# Patient Record
Sex: Male | Born: 1980 | Race: White | Hispanic: No | Marital: Single | Smoking: Current every day smoker
Health system: Southern US, Community
[De-identification: ages and names within clinical notes are randomized; demographics above are authoritative.]

## PROBLEM LIST (undated history)

## (undated) DIAGNOSIS — I10 Essential (primary) hypertension: Secondary | ICD-10-CM

## (undated) DIAGNOSIS — E119 Type 2 diabetes mellitus without complications: Secondary | ICD-10-CM

## (undated) DIAGNOSIS — I509 Heart failure, unspecified: Secondary | ICD-10-CM

---

## 2008-05-01 ENCOUNTER — Inpatient Hospital Stay (HOSPITAL_COMMUNITY): Admission: EM | Admit: 2008-05-01 | Discharge: 2008-05-21 | Payer: Self-pay | Admitting: Pulmonary Disease

## 2008-05-01 ENCOUNTER — Ambulatory Visit: Payer: Self-pay | Admitting: Pulmonary Disease

## 2008-05-02 ENCOUNTER — Encounter: Payer: Self-pay | Admitting: Internal Medicine

## 2008-05-08 ENCOUNTER — Ambulatory Visit: Payer: Self-pay | Admitting: Infectious Diseases

## 2010-12-13 NOTE — Consult Note (Signed)
NAMEKENYON, Micheal Zavala                   ACCOUNT NO.:  [id number]   MEDICAL RECORD NO.:  [id number]          PATIENT TYPE:  INP   LOCATION:  2107                         FACILITY:  [facility]   PHYSICIAN:  Maree Krabbe, M.D.DATE OF BIRTH:  Sep 29, 1980   DATE OF CONSULTATION:  DATE OF DISCHARGE:                                 CONSULTATION   REASON FOR CONSULT:  Elevated creatinine.   HISTORY:  This is a 30 year old male with no significant past medical  history.  He has been sick for about a week.  The family says he had the  flu initially with fever, chills, and cough.  Four days prior to  admission, on May 01, 2008, he was diagnosed with influenza A, I  believe probably in an emergency room or urgent care setting.  He then  developed profuse watery diarrhea, high volume according to the pt's  wife, two days prior to admission.  He was then was admitted on May 01, 2008, with respiratory failure and diffuse bilateral pulmonary  infiltrates.  The patient's creatinine was normal on admission at 0.7,  yesterday increased up to 1.7 and today is 2.9.  Urine output has been  low less than 30 mL/hour.  Blood pressures here in the hospital have  been low in the 90s to low 100s primarily and some in the 80s yesterday.  The patient has not received any nephrotoxins, has not received any IV  contrast, ACE inhibitors, etc.   He was admitted to the hospital on May 01, 2008, started on broad-  spectrum antibiotics as well as Tamiflu suspension.  He is currently on  Zithromax, Zosyn, and vancomycin.  He has been febrile.  His mental  status has been good.  Urine output has been poor as I mentioned above.   PAST MEDICAL HISTORY:  None.   MEDICATIONS AT HOME:  None.   CURRENT MEDICATIONS:  Tamiflu, Zosyn, vancomycin, Zithromax IV,  Lopressor 12.5 mg b.i.d., Protonix 40 via tube, Jevity 80 mL an hour,  subcu heparin prophylactic, and sedation protocols.   SOCIAL HISTORY:  The patient lives  with his wife.  The family is not  available to answer questions about alcohol, drug, or tobacco use.   REVIEW OF SYSTEMS:  As noted above.  He has no prior history of any  kidney problems, kidney stones, kidney failure, protein, or blood in the  urine.  The rest of the 12-point review of systems was negative  according to the wife.   PHYSICAL EXAMINATION:  VITAL SIGNS:  Blood pressure currently 106/70,  heart rate 90s, respirations 18 on the ventilator.  Temperature 100.0.  GENERAL:  The patient is an obese white male.  He awakens easily, grips  with both hands, nods his head and responds to questions appropriately.  SKIN:  Warm and dry.  There is no rash.  HEENT:  Pupils are constricted at 1-2 mm symmetric.  Fundi could not be  visualized.  Throat cannot be examined due to the ET tube and the  orogastric tube.  TMs were occluded by cerumen.  NECK:  Supple.  There is no JVD.  Neck veins are flat.  CHEST:  Some coarse breath sounds at both posterior lung fields.  No  wheezing.  CARDIAC:  Regular rate and rhythm with no murmur, rub, or gallop.  ABDOMEN:  Obese, soft, and nontender.  The liver percusses to 8 cm in  the right midclavicular line.  There are no masses.  No ascites.  EXTREMITIES:  Femoral pulses are soft.  Pedal pulses are intact.  There  is no edema whatsoever in the legs, abdominal wall, or flanks.   LABORATORY DATA:  PH 7.31/PCO2 48, PO2 is 68, white blood count 4000,  hemoglobin 12, platelets are normal.  Sodium 137, potassium 4.2, bicarb  22, BUN 42, creatinine 2.92, calcium 8, phosphorus 4.8, magnesium 2.3.  CPK 246, vancomycin level 38.  Urinalysis shows 100 protein, 3 to 6 red  blood cells, strep pneumoniae urinary antigen negative.  Chest x-ray,  diffuse bilateral airspace disease.   IMPRESSION:  1. Acute renal failure due to hypoperfusion and volume depletion      primarily; he may have an element of sepsis but he also has      significant room for volume.  2.  Recent flu-like illness for 1 week with a two-day history of acute      diarrhea prior to admission.  3. Bilateral pneumonia with respiratory failure.  4. Obesity.   RECOMMENDATIONS:  Increase fluids significantly, watch urine output and  serum creatinine.  We will follow with you.  Avoid nephrotoxins.      Maree Krabbe, M.D.  Electronically Signed     RDS/MEDQ  D:  05/03/2008  T:  05/04/2008  Job:  [id number]

## 2010-12-13 NOTE — Discharge Summary (Signed)
NAMELEMOND, GRIFFEE                   ACCOUNT NO.:  [id number]   MEDICAL RECORD NO.:  [id number]          PATIENT TYPE:  INP   LOCATION:  6743                         FACILITY:  [facility]   PHYSICIAN:  Oretha Milch, MD      DATE OF BIRTH:  May 27, 1981   DATE OF ADMISSION:  05/01/2008  DATE OF DISCHARGE:  05/21/2008                               DISCHARGE SUMMARY   FINAL DIAGNOSES:  1. Resolved acute respiratory failure/acute respiratory distress      syndrome secondary to H1N1 virus.  2. Resolved severe sepsis septic shock with multiple organ dysfunction      secondary to acute respiratory failure/acute respiratory distress      syndrome.  3. Resolving acute renal failure.   PROCEDURES:  1. Right subclavian triple-lumen catheter placed on May 01, 2008,      removed on May 08, 2008.  2. Left intrajugular triple-lumen catheter placed on May 08, 2008,      removed prior to discharge.  3. Right intrajugular hemodialysis catheter placed on May 09, 2008, removed on May 20, 2008.  4. Right radial arterial line placed on May 01, 2008, removed on      May 08, 2008.  5. Endotracheal tube placed on May 01, 2008, removed on May 10, 2008.   MICROBIOLOGY:  Blood cultures x2, both on May 19, 2008, no growth to  date.  Blood cultures x2 on May 12, 2008, negative.  Cath tip on  May 13, 2008, negative.  Cath tip on May 10, 2008, negative.  Blood cultures on May 07, 2008, negative.  Bronchial Legionella smear  negative on May 07, 2008.  Blood cultures on May 06, 2008,  negative.  Sputum culture on May 08, 2008, demonstrating few Candida  albicans.  Respiratory virus panel on May 07, 2008, showing influenza  A, positive H1N1.   BRIEF HISTORY:  This is a 30 year old male patient who presented to  [facility] with complaints of increased respiratory distress and  pneumonia.  He had a 4-day history of positive influenza A,  discharged  from the emergency room at that point.  Upon presentation at [facility], he was placed on BiPAP with little improvement.  He,  therefore, ended up being intubated for progressive respiratory failure.   HOSPITAL COURSE:  1. Acute respiratory failure secondary to acute respiratory distress      syndrome in the setting of influenza A.  Mr. Haywood as previously      mentioned was admitted to the Intensive Care on mechanical      ventilation who was placed on full ventilatory support, treated      empirically with broad-spectrum antibiotics as well as Tamiflu.  He      was treated with low-tidal volume mechanical ventilation,      aggressive sedation, and high PEEP.  He was eventually successfully      liberated from mechanical ventilation as on date noted above.  Upon      time of discharge, his pulmonary status  has improved to baseline.      He is off oxygen, has no residual pulmonary issues.  He was treated      empirically as mentioned before with oral Tamiflu x7 days,      additionally, he completed a full course of therapy with      ceftriaxone and azithromycin.  2. Severe sepsis septic shock secondary to acute respiratory failure.      This was treated in the usual fashion with aggressive goal-directed      therapy including volume resuscitation, brief support with IV      vasopressors including Levophed and vasopressin, and close      observation in the ICU.  He continued to improve.  He is now      hemodynamically stable.  He did develop multiple organ distress      syndrome during acute stay, he now has resolving acute renal      failure.  3. Acute renal failure secondary to severe sepsis septic shock.  This      is slowly improving.  Nephrology is following.  His creatinine is      making rapid improvements upon time of discharge.  He will be      followed by Dr. Eliott Nine in the outpatient setting with plans to have      his creatinine drawn at [facility]  and call to Dr. Eliott Nine,      and also a face-to-face followup with Dr. Eliott Nine in the future.  4. Anemia.  For this, he will continue ferrous sulfate.  5. Hypertension.  For this, he will continue a low-sodium diet.   DISCHARGE LABORATORY DATA:  On May 21, 2008, sodium 132, potassium  3.7, chloride 99, CO2 19, glucose 87, BUN 69, creatinine 5.23, this is  compared to May 20, 2008, with sodium 131, potassium 4.3, chloride  99, CO2 20, glucose 97, BUN 75, creatinine 6.74.  CBC on time of  discharge white blood cell count 9, hemoglobin 8.7, hematocrit 24.3,  platelet count 283.  Iron levels 31, total iron binding capacity is  365%, saturation UIBC 334.   DISCHARGE INSTRUCTIONS:  Instructed to avoid NSAIDs, follow a low-sodium  diet, increase activity as tolerated.  He will go to [facility]  first on May 28, 2008, where he will have his renal profile drawn  and call to Dr. Eliott Nine.  He will also have a followup with Dr. Eliott Nine,  her office will arrange this.   DISCHARGE MEDICATIONS:  1. Aspirin 81 mg daily.  2. Ferrous sulfate 325 mg 1 tablet 3 times a day.  3. Tums E-X 2 tablets daily with meals.  4. Tylenol 325 mg 2 tablets q.4 h. as needed for pain.   DISPOSITION:  Mr. Schlageter has met maximum benefit from inpatient stay.  He  is now medically cleared for discharge with outpatient followup with Dr.  Eliott Nine.      Zenia Resides, NP      Oretha Milch, MD  Electronically Signed    PB/MEDQ  D:  05/21/2008  T:  05/21/2008  Job:  [id number]

## 2011-05-02 LAB — POCT I-STAT 3, ART BLOOD GAS (G3+)
Acid-Base Excess: 2
Acid-base deficit: 1
Acid-base deficit: 2
Acid-base deficit: 2
Acid-base deficit: 4 — ABNORMAL HIGH
Acid-base deficit: 4 — ABNORMAL HIGH
Acid-base deficit: 5 — ABNORMAL HIGH
Acid-base deficit: 6 — ABNORMAL HIGH
Bicarbonate: 20.1
Bicarbonate: 22.1
Bicarbonate: 22.6
Bicarbonate: 23.7
Bicarbonate: 23.7
Bicarbonate: 23.8
Bicarbonate: 24
Bicarbonate: 24.2 — ABNORMAL HIGH
Bicarbonate: 24.6 — ABNORMAL HIGH
Bicarbonate: 31.1 — ABNORMAL HIGH
O2 Saturation: 88
O2 Saturation: 91
O2 Saturation: 93
O2 Saturation: 93
O2 Saturation: 94
O2 Saturation: 94
O2 Saturation: 95
O2 Saturation: 95
O2 Saturation: 99
Patient temperature: 100.3
Patient temperature: 101
Patient temperature: 102
Patient temperature: 102
Patient temperature: 102.4
Patient temperature: 102.8
Patient temperature: 99
Patient temperature: 99.7
TCO2: 21
TCO2: 23
TCO2: 24
TCO2: 25
TCO2: 25
TCO2: 26
TCO2: 34
pCO2 arterial: 111.8
pCO2 arterial: 37.7
pCO2 arterial: 39.3
pCO2 arterial: 40.3
pCO2 arterial: 42.2
pCO2 arterial: 45.1 — ABNORMAL HIGH
pCO2 arterial: 45.7 — ABNORMAL HIGH
pCO2 arterial: 46.6 — ABNORMAL HIGH
pCO2 arterial: 48.5 — ABNORMAL HIGH
pCO2 arterial: 49.8 — ABNORMAL HIGH
pH, Arterial: 7.064 — CL
pH, Arterial: 7.286 — ABNORMAL LOW
pH, Arterial: 7.3 — ABNORMAL LOW
pH, Arterial: 7.306 — ABNORMAL LOW
pH, Arterial: 7.31 — ABNORMAL LOW
pH, Arterial: 7.334 — ABNORMAL LOW
pH, Arterial: 7.34 — ABNORMAL LOW
pH, Arterial: 7.431
pO2, Arterial: 177 — ABNORMAL HIGH
pO2, Arterial: 66 — ABNORMAL LOW
pO2, Arterial: 68 — ABNORMAL LOW
pO2, Arterial: 71 — ABNORMAL LOW
pO2, Arterial: 71 — ABNORMAL LOW
pO2, Arterial: 75 — ABNORMAL LOW
pO2, Arterial: 79 — ABNORMAL LOW
pO2, Arterial: 89
pO2, Arterial: 93

## 2011-05-02 LAB — GLUCOSE, CAPILLARY
Glucose-Capillary: 101 — ABNORMAL HIGH
Glucose-Capillary: 103 — ABNORMAL HIGH
Glucose-Capillary: 106 — ABNORMAL HIGH
Glucose-Capillary: 108 — ABNORMAL HIGH
Glucose-Capillary: 114 — ABNORMAL HIGH
Glucose-Capillary: 119 — ABNORMAL HIGH
Glucose-Capillary: 123 — ABNORMAL HIGH
Glucose-Capillary: 125 — ABNORMAL HIGH
Glucose-Capillary: 125 — ABNORMAL HIGH
Glucose-Capillary: 129 — ABNORMAL HIGH
Glucose-Capillary: 132 — ABNORMAL HIGH
Glucose-Capillary: 136 — ABNORMAL HIGH
Glucose-Capillary: 137 — ABNORMAL HIGH
Glucose-Capillary: 141 — ABNORMAL HIGH
Glucose-Capillary: 145 — ABNORMAL HIGH
Glucose-Capillary: 145 — ABNORMAL HIGH
Glucose-Capillary: 151 — ABNORMAL HIGH
Glucose-Capillary: 159 — ABNORMAL HIGH
Glucose-Capillary: 159 — ABNORMAL HIGH
Glucose-Capillary: 159 — ABNORMAL HIGH
Glucose-Capillary: 161 — ABNORMAL HIGH
Glucose-Capillary: 168 — ABNORMAL HIGH
Glucose-Capillary: 168 — ABNORMAL HIGH
Glucose-Capillary: 225 — ABNORMAL HIGH
Glucose-Capillary: 97

## 2011-05-02 LAB — CULTURE, BLOOD (ROUTINE X 2)
Culture: NO GROWTH
Culture: NO GROWTH

## 2011-05-02 LAB — MYCOPLASMA PNEUMONIAE BY PCR

## 2011-05-02 LAB — RENAL FUNCTION PANEL
Albumin: 1.9 — ABNORMAL LOW
Albumin: 2.2 — ABNORMAL LOW
Albumin: 2.5 — ABNORMAL LOW
Albumin: 3 — ABNORMAL LOW
Albumin: 3.1 — ABNORMAL LOW
Albumin: 3.1 — ABNORMAL LOW
BUN: 53 — ABNORMAL HIGH
BUN: 69 — ABNORMAL HIGH
BUN: 73 — ABNORMAL HIGH
BUN: 80 — ABNORMAL HIGH
BUN: 80 — ABNORMAL HIGH
BUN: 81 — ABNORMAL HIGH
BUN: 92 — ABNORMAL HIGH
CO2: 19
CO2: 19
CO2: 20
CO2: 22
CO2: 23
Calcium: 8.1 — ABNORMAL LOW
Calcium: 8.1 — ABNORMAL LOW
Calcium: 8.6
Calcium: 8.9
Calcium: 8.9
Calcium: 9.2
Chloride: 100
Chloride: 103
Chloride: 108
Chloride: 99
Creatinine, Ser: 11.43 — ABNORMAL HIGH
Creatinine, Ser: 11.91 — ABNORMAL HIGH
Creatinine, Ser: 5.23 — ABNORMAL HIGH
Creatinine, Ser: 7.79 — ABNORMAL HIGH
Creatinine, Ser: 7.81 — ABNORMAL HIGH
GFR calc Af Amer: 10 — ABNORMAL LOW
GFR calc Af Amer: 7 — ABNORMAL LOW
GFR calc non Af Amer: 5 — ABNORMAL LOW
Glucose, Bld: 104 — ABNORMAL HIGH
Glucose, Bld: 149 — ABNORMAL HIGH
Glucose, Bld: 77
Glucose, Bld: 95
Glucose, Bld: 97
Glucose, Bld: 98
Phosphorus: 10 — ABNORMAL HIGH
Phosphorus: 11.7 — ABNORMAL HIGH
Phosphorus: 4
Phosphorus: 6.2 — ABNORMAL HIGH
Phosphorus: 7.5 — ABNORMAL HIGH
Phosphorus: 9.5 — ABNORMAL HIGH
Potassium: 3.8
Potassium: 4.1
Potassium: 4.5
Potassium: 4.7
Sodium: 129 — ABNORMAL LOW
Sodium: 131 — ABNORMAL LOW
Sodium: 136
Sodium: 139
Sodium: 142

## 2011-05-02 LAB — STREP PNEUMONIAE URINARY ANTIGEN: Strep Pneumo Urinary Antigen: NEGATIVE

## 2011-05-02 LAB — CBC
HCT: 24.3 — ABNORMAL LOW
HCT: 24.6 — ABNORMAL LOW
HCT: 25.7 — ABNORMAL LOW
HCT: 25.8 — ABNORMAL LOW
HCT: 26.5 — ABNORMAL LOW
HCT: 31.1 — ABNORMAL LOW
HCT: 33.3 — ABNORMAL LOW
HCT: 36.3 — ABNORMAL LOW
HCT: 41.1
HCT: 44.1
Hemoglobin: 10.5 — ABNORMAL LOW
Hemoglobin: 14
Hemoglobin: 8.6 — ABNORMAL LOW
Hemoglobin: 8.9 — ABNORMAL LOW
Hemoglobin: 9 — ABNORMAL LOW
MCHC: 33.5
MCHC: 33.6
MCHC: 33.6
MCHC: 33.8
MCHC: 34.2
MCHC: 34.3
MCHC: 34.4
MCHC: 34.4
MCHC: 34.6
MCHC: 35.2
MCHC: 35.3
MCV: 89.8
MCV: 90.4
MCV: 90.5
MCV: 91.1
MCV: 91.2
MCV: 91.5
MCV: 91.7
MCV: 92.1
MCV: 92.1
MCV: 92.4
Platelets: 205
Platelets: 224
Platelets: 227
Platelets: 245
Platelets: 283
Platelets: 300
Platelets: 344
Platelets: 349
Platelets: 389
RBC: 2.69 — ABNORMAL LOW
RBC: 2.81 — ABNORMAL LOW
RBC: 3.34 — ABNORMAL LOW
RBC: 3.36 — ABNORMAL LOW
RBC: 3.59 — ABNORMAL LOW
RBC: 3.65 — ABNORMAL LOW
RBC: 3.71 — ABNORMAL LOW
RBC: 3.92 — ABNORMAL LOW
RBC: 4.47
RDW: 13.5
RDW: 13.6
RDW: 13.6
RDW: 14
RDW: 14.4
RDW: 14.9
WBC: 18.8 — ABNORMAL HIGH
WBC: 28 — ABNORMAL HIGH
WBC: 3.3 — ABNORMAL LOW
WBC: 3.7 — ABNORMAL LOW
WBC: 35.5 — ABNORMAL HIGH
WBC: 4.9
WBC: 9

## 2011-05-02 LAB — URINALYSIS, ROUTINE W REFLEX MICROSCOPIC
Bilirubin Urine: NEGATIVE
Ketones, ur: NEGATIVE
Leukocytes, UA: NEGATIVE
Leukocytes, UA: NEGATIVE
Nitrite: NEGATIVE
Nitrite: NEGATIVE
Nitrite: NEGATIVE
Protein, ur: 100 — AB
Specific Gravity, Urine: 1.026
Specific Gravity, Urine: 1.031 — ABNORMAL HIGH
Urobilinogen, UA: 0.2
pH: 5
pH: 5.5
pH: 6

## 2011-05-02 LAB — BASIC METABOLIC PANEL
BUN: 100 — ABNORMAL HIGH
BUN: 103 — ABNORMAL HIGH
BUN: 105 — ABNORMAL HIGH
BUN: 19
BUN: 38 — ABNORMAL HIGH
BUN: 42 — ABNORMAL HIGH
BUN: 55 — ABNORMAL HIGH
BUN: 64 — ABNORMAL HIGH
BUN: 66 — ABNORMAL HIGH
BUN: 84 — ABNORMAL HIGH
BUN: 89 — ABNORMAL HIGH
CO2: 16 — ABNORMAL LOW
CO2: 17 — ABNORMAL LOW
CO2: 20
CO2: 21
CO2: 22
CO2: 22
CO2: 22
CO2: 23
CO2: 23
CO2: 23
Calcium: 7.7 — ABNORMAL LOW
Calcium: 8.4
Calcium: 8.5
Calcium: 8.7
Calcium: 8.9
Calcium: 9.1
Calcium: 9.5
Chloride: 102
Chloride: 102
Chloride: 102
Chloride: 103
Chloride: 110
Chloride: 92 — ABNORMAL LOW
Chloride: 98
Chloride: 99
Chloride: 99
Creatinine, Ser: 1.75 — ABNORMAL HIGH
Creatinine, Ser: 10.23 — ABNORMAL HIGH
Creatinine, Ser: 10.47 — ABNORMAL HIGH
Creatinine, Ser: 10.87 — ABNORMAL HIGH
Creatinine, Ser: 11 — ABNORMAL HIGH
Creatinine, Ser: 11.43 — ABNORMAL HIGH
Creatinine, Ser: 4.99 — ABNORMAL HIGH
Creatinine, Ser: 5.58 — ABNORMAL HIGH
Creatinine, Ser: 6.81 — ABNORMAL HIGH
Creatinine, Ser: 7.17 — ABNORMAL HIGH
Creatinine, Ser: 8.02 — ABNORMAL HIGH
Creatinine, Ser: 8.4 — ABNORMAL HIGH
Creatinine, Ser: 8.96 — ABNORMAL HIGH
GFR calc Af Amer: 10 — ABNORMAL LOW
GFR calc Af Amer: 11 — ABNORMAL LOW
GFR calc Af Amer: 12 — ABNORMAL LOW
GFR calc Af Amer: 17 — ABNORMAL LOW
GFR calc Af Amer: 32 — ABNORMAL LOW
GFR calc Af Amer: 57 — ABNORMAL LOW
GFR calc Af Amer: 6 — ABNORMAL LOW
GFR calc Af Amer: 7 — ABNORMAL LOW
GFR calc Af Amer: 8 — ABNORMAL LOW
GFR calc Af Amer: 9 — ABNORMAL LOW
GFR calc Af Amer: 9 — ABNORMAL LOW
GFR calc non Af Amer: 10 — ABNORMAL LOW
GFR calc non Af Amer: 26 — ABNORMAL LOW
GFR calc non Af Amer: 5 — ABNORMAL LOW
GFR calc non Af Amer: 6 — ABNORMAL LOW
GFR calc non Af Amer: 7 — ABNORMAL LOW
GFR calc non Af Amer: 7 — ABNORMAL LOW
GFR calc non Af Amer: 8 — ABNORMAL LOW
Glucose, Bld: 112 — ABNORMAL HIGH
Glucose, Bld: 117 — ABNORMAL HIGH
Glucose, Bld: 120 — ABNORMAL HIGH
Glucose, Bld: 120 — ABNORMAL HIGH
Glucose, Bld: 120 — ABNORMAL HIGH
Glucose, Bld: 142 — ABNORMAL HIGH
Glucose, Bld: 154 — ABNORMAL HIGH
Glucose, Bld: 155 — ABNORMAL HIGH
Glucose, Bld: 161 — ABNORMAL HIGH
Glucose, Bld: 88
Potassium: 3.7
Potassium: 3.9
Potassium: 3.9
Potassium: 4
Potassium: 4
Potassium: 4.2
Potassium: 4.4
Potassium: 4.5
Sodium: 136
Sodium: 137
Sodium: 138
Sodium: 139
Sodium: 141
Sodium: 142

## 2011-05-02 LAB — CATH TIP CULTURE
Culture: NO GROWTH
Culture: NO GROWTH
Culture: NO GROWTH

## 2011-05-02 LAB — HEPATITIS PANEL, ACUTE
HCV Ab: NEGATIVE
Hepatitis B Surface Ag: NEGATIVE

## 2011-05-02 LAB — DIFFERENTIAL
Basophils Absolute: 0
Basophils Absolute: 0
Basophils Absolute: 0.1
Basophils Relative: 0
Basophils Relative: 1
Eosinophils Absolute: 0.4
Eosinophils Relative: 0
Eosinophils Relative: 2
Lymphs Abs: 2.4
Monocytes Absolute: 0.1
Monocytes Relative: 12
Neutro Abs: 2.9
Neutro Abs: 27.4 — ABNORMAL HIGH
Neutrophils Relative %: 69

## 2011-05-02 LAB — URINE CULTURE
Colony Count: NO GROWTH
Culture: NO GROWTH
Special Requests: NEGATIVE

## 2011-05-02 LAB — CHOLESTEROL, TOTAL: Cholesterol: 100

## 2011-05-02 LAB — COMPREHENSIVE METABOLIC PANEL
ALT: 37
AST: 59 — ABNORMAL HIGH
AST: 83 — ABNORMAL HIGH
Albumin: 2.3 — ABNORMAL LOW
Albumin: 2.9 — ABNORMAL LOW
Alkaline Phosphatase: 70
BUN: 108 — ABNORMAL HIGH
BUN: 11
CO2: 19
CO2: 22
CO2: 27
Calcium: 8.4
Calcium: 9
Chloride: 103
Chloride: 99
Creatinine, Ser: 0.77
Creatinine, Ser: 10.32 — ABNORMAL HIGH
GFR calc Af Amer: 7 — ABNORMAL LOW
GFR calc Af Amer: 9 — ABNORMAL LOW
GFR calc non Af Amer: 6 — ABNORMAL LOW
GFR calc non Af Amer: 8 — ABNORMAL LOW
GFR calc non Af Amer: >60
Glucose, Bld: 88
Potassium: 3.6
Sodium: 133 — ABNORMAL LOW
Total Bilirubin: 0.8
Total Bilirubin: 1

## 2011-05-02 LAB — MISCELLANEOUS TEST

## 2011-05-02 LAB — TRIGLYCERIDES
Triglycerides: 355 — ABNORMAL HIGH
Triglycerides: 417 — ABNORMAL HIGH
Triglycerides: 761 — ABNORMAL HIGH

## 2011-05-02 LAB — HEPATIC FUNCTION PANEL
Albumin: 2.3 — ABNORMAL LOW
Albumin: 2.9 — ABNORMAL LOW
Alkaline Phosphatase: 138 — ABNORMAL HIGH
Indirect Bilirubin: 0.6
Total Protein: 7.2
Total Protein: 8.9 — ABNORMAL HIGH

## 2011-05-02 LAB — ABO/RH: ABO/RH(D): O POS

## 2011-05-02 LAB — FUNGUS CULTURE W SMEAR

## 2011-05-02 LAB — CARDIAC PANEL(CRET KIN+CKTOT+MB+TROPI)
CK, MB: 1.2
Relative Index: 0.5
Total CK: 220
Troponin I: 0.05

## 2011-05-02 LAB — MAGNESIUM
Magnesium: 1.8
Magnesium: 2.3
Magnesium: 2.5
Magnesium: 2.6 — ABNORMAL HIGH
Magnesium: 2.7 — ABNORMAL HIGH
Magnesium: 2.8 — ABNORMAL HIGH
Magnesium: 2.9 — ABNORMAL HIGH
Magnesium: 2.9 — ABNORMAL HIGH
Magnesium: 3 — ABNORMAL HIGH
Magnesium: 3 — ABNORMAL HIGH
Magnesium: 3.1 — ABNORMAL HIGH
Magnesium: 3.2 — ABNORMAL HIGH
Magnesium: 3.3 — ABNORMAL HIGH
Magnesium: 3.4 — ABNORMAL HIGH

## 2011-05-02 LAB — CULTURE, RESPIRATORY W GRAM STAIN

## 2011-05-02 LAB — CLOSTRIDIUM DIFFICILE EIA

## 2011-05-02 LAB — PHOSPHORUS
Phosphorus: 10 — ABNORMAL HIGH
Phosphorus: 11.7 — ABNORMAL HIGH
Phosphorus: 3.5
Phosphorus: 4.9 — ABNORMAL HIGH
Phosphorus: 5.1 — ABNORMAL HIGH
Phosphorus: 7.1 — ABNORMAL HIGH
Phosphorus: 7.5 — ABNORMAL HIGH
Phosphorus: 8.2 — ABNORMAL HIGH
Phosphorus: 9.2 — ABNORMAL HIGH

## 2011-05-02 LAB — URINE MICROSCOPIC-ADD ON

## 2011-05-02 LAB — AFB CULTURE WITH SMEAR (NOT AT ARMC): Acid Fast Smear: NONE SEEN

## 2011-05-02 LAB — C4 COMPLEMENT: Complement C4, Body Fluid: 25

## 2011-05-02 LAB — VIRUS CULTURE

## 2011-05-02 LAB — VANCOMYCIN, TROUGH: Vancomycin Tr: 38.9

## 2011-05-02 LAB — C3 COMPLEMENT: C3 Complement: 128

## 2011-05-02 LAB — CK TOTAL AND CKMB (NOT AT ARMC)
CK, MB: 1.5
Relative Index: 0.7

## 2011-05-02 LAB — AMYLASE: Amylase: 227 — ABNORMAL HIGH

## 2011-05-02 LAB — PROTIME-INR
INR: 1.1
INR: 1.2
Prothrombin Time: 14.9
Prothrombin Time: 15.2

## 2011-05-02 LAB — IRON AND TIBC
Iron: 65
Saturation Ratios: 8 — ABNORMAL LOW
UIBC: 250

## 2011-05-02 LAB — LIPASE, BLOOD: Lipase: 138 — ABNORMAL HIGH

## 2011-05-02 LAB — Q FEVER ABS IGG, IGM W/ REFLEX TITER
Q Fever Phase I: <1:16 {titer}
Q Fever Phase II: <1:16 {titer}

## 2011-05-02 LAB — CRYPTOCOCCAL ANTIGEN: Crypto Ag: NEGATIVE

## 2011-05-02 LAB — TYPE AND SCREEN
ABO/RH(D): O POS
Antibody Screen: NEGATIVE

## 2011-05-02 LAB — VANCOMYCIN, RANDOM: Vancomycin Rm: 37.9

## 2011-05-02 LAB — LEGIONELLA ANTIGEN, URINE: Legionella Antigen, Urine: NEGATIVE

## 2011-05-02 LAB — SEDIMENTATION RATE: Sed Rate: 120 — ABNORMAL HIGH

## 2011-05-02 LAB — T-HELPER CELLS (CD4) COUNT (NOT AT ARMC): CD4 T Cell Abs: 630

## 2018-01-22 ENCOUNTER — Ambulatory Visit (INDEPENDENT_AMBULATORY_CARE_PROVIDER_SITE_OTHER): Payer: Medicaid Other | Admitting: Cardiovascular Disease

## 2018-01-22 ENCOUNTER — Encounter: Payer: Self-pay | Admitting: *Deleted

## 2018-01-22 ENCOUNTER — Encounter: Payer: Self-pay | Admitting: Cardiovascular Disease

## 2018-01-22 VITALS — BP 158/100 | HR 100 | Ht 69.0 in | Wt 282.0 lb

## 2018-01-22 DIAGNOSIS — R0602 Shortness of breath: Secondary | ICD-10-CM

## 2018-01-22 DIAGNOSIS — R011 Cardiac murmur, unspecified: Secondary | ICD-10-CM | POA: Diagnosis not present

## 2018-01-22 DIAGNOSIS — I509 Heart failure, unspecified: Secondary | ICD-10-CM

## 2018-01-22 DIAGNOSIS — Z716 Tobacco abuse counseling: Secondary | ICD-10-CM | POA: Diagnosis not present

## 2018-01-22 DIAGNOSIS — I10 Essential (primary) hypertension: Secondary | ICD-10-CM | POA: Diagnosis not present

## 2018-01-22 DIAGNOSIS — E1165 Type 2 diabetes mellitus with hyperglycemia: Secondary | ICD-10-CM | POA: Diagnosis not present

## 2018-01-22 DIAGNOSIS — R079 Chest pain, unspecified: Secondary | ICD-10-CM | POA: Diagnosis not present

## 2018-01-22 DIAGNOSIS — E782 Mixed hyperlipidemia: Secondary | ICD-10-CM | POA: Diagnosis not present

## 2018-01-22 MED ORDER — METOPROLOL TARTRATE 50 MG PO TABS: ORAL_TABLET | ORAL | 0 refills | Status: DC

## 2018-01-22 MED ORDER — ROSUVASTATIN CALCIUM 10 MG PO TABS: 10.0000 mg | ORAL_TABLET | Freq: Every day | ORAL | 1 refills | Status: DC

## 2018-01-22 NOTE — Progress Notes (Signed)
CARDIOLOGY CONSULT NOTE  Patient ID: Micheal Zavala MRN: [id number] DOB/AGE: 1980-10-04 36 y.o.  Admit date: (Not on file) Primary Physician: Domenick Bookbinder, PA-C Referring Physician: Domenick Bookbinder, PA-C  Reason for Consultation: Cardiac murmur, CHF  HPI: Micheal Zavala is a 37 y.o. male who is being seen today for the evaluation of cardiac murmur and CHF at the request of Domenick Bookbinder, PA-C.   I reviewed notes from his PCP.  He has a past medical history significant for hypertension and type 2 diabetes mellitus.  He has not seen a doctor in several years until he recently established care with a PCP on 01/18/2018.    He has apparently never seen a cardiologist.  I reviewed several labs dated 01/18/2018: White blood cells 8.3, hemoglobin 16, platelets 243, BUN 10, creatinine 0.63, sodium 137, potassium 4.3, normal liver transaminases, elevated hemoglobin A1c of 10.3%, total cholesterol 180, triglycerides 437, HDL 35, LDL could not be calculated.  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  He has not gone to a physician since that time until recently.  He tells me he was diagnosed with congestive heart failure at that time.  He has been having chest pain, exertional dyspnea, and bilateral leg edema.  He went to [facility] a few weeks ago and they were going to do an echocardiogram but because he had to wait several hours he left before it was done.  He has stabbing precordial pain lasting minutes and sometimes hours.  This is been going on for the past 6 months.  He has had a nonproductive cough for the past 2 months.  He denies fevers.  He denies orthopnea and paroxysmal nocturnal dyspnea.  He said his legs were much more swollen about 3 weeks ago.  He has smoked a pack of cigarettes daily since the age of 60 but has recently cut back to 4 to 5 cigarettes daily.  He has exertional dyspnea which is intermittent.  He sweats a  lot with activity.  An ECG was reportedly performed within the past several weeks which I will request for personal review.  Family history: Mother had coronary artery disease.  Maternal grandfather had an MI and died in his 38s.  At least 2 maternal uncles have coronary artery disease and stents.     No Known Allergies  Current Outpatient Medications  Medication Sig Dispense Refill  . amLODipine (NORVASC) 5 MG tablet Take 5 mg by mouth daily.    . metoprolol tartrate (LOPRESSOR) 50 MG tablet TAKE 1 TABLET 1 HOUR BEFORE PROCEDURE 1 tablet 0  . rosuvastatin (CRESTOR) 10 MG tablet Take 1 tablet (10 mg total) by mouth daily. 90 tablet 1   No current facility-administered medications for this visit.     History reviewed. No pertinent past medical history.  History reviewed. No pertinent surgical history.  Social History   Socioeconomic History  . Marital status: Single    Spouse name: Not on file  . Number of children: Not on file  . Years of education: Not on file  . Highest education level: Not on file  Occupational History  . Not on file  Social Needs  . Financial resource strain: Not on file  . Food insecurity:    Worry: Not on file    Inability: Not on file  . Transportation needs:    Medical: Not  on file    Non-medical: Not on file  Tobacco Use  . Smoking status: Current Every Day Smoker    Packs/day: 1.00    Types: Cigarettes  . Smokeless tobacco: Never Used  Substance and Sexual Activity  . Alcohol use: Not on file  . Drug use: Not on file  . Sexual activity: Not on file  Lifestyle  . Physical activity:    Days per week: Not on file    Minutes per session: Not on file  . Stress: Not on file  Relationships  . Social connections:    Talks on phone: Not on file    Gets together: Not on file    Attends religious service: Not on file    Active member of club or organization: Not on file    Attends meetings of clubs or organizations: Not on file     Relationship status: Not on file  . Intimate partner violence:    Fear of current or ex partner: Not on file    Emotionally abused: Not on file    Physically abused: Not on file    Forced sexual activity: Not on file  Other Topics Concern  . Not on file  Social History Narrative  . Not on file      Current Meds  Medication Sig  . amLODipine (NORVASC) 5 MG tablet Take 5 mg by mouth daily.      Review of systems complete and found to be negative unless listed above in HPI    Physical exam Blood pressure (!) 158/100, pulse 100, height 5\' 9"  (1.753 m), weight 282 lb (127.9 kg), SpO2 98 %. General: NAD Neck: No JVD, no thyromegaly or thyroid nodule.  Lungs: Clear to auscultation bilaterally with normal respiratory effort. CV: Nondisplaced PMI. Regular rate and rhythm, normal S1/S2, no S3/S4, 2/6 systolic murmur.  No peripheral edema.  No carotid bruit.  Abdomen: Firm, nontender, obese.  Skin: Intact without lesions or rashes.  Neurologic: Alert and oriented x 3.  Psych: Normal affect. Extremities: No clubbing or cyanosis.  HEENT: Normal.   ECG: Most recent ECG will be requested.   Labs: Lab Results  Component Value Date/Time   K 3.7 05/21/2008 06:09 AM   BUN 69 (H) 05/21/2008 06:09 AM   CREATININE 5.23 (H) 05/21/2008 06:09 AM   ALT 37 05/12/2008 05:10 AM   HGB 8.2 (L) 05/21/2008 06:09 AM     Lipids: Lab Results  Component Value Date/Time   CHOL  05/08/2008 01:00 PM    100        ATP III CLASSIFICATION:  <200     mg/dL   Desirable  130-865200-239  mg/dL   Borderline High  >=784>=240    mg/dL   High   TRIG [id number] (H) 29/52/841310/18/2009 06:16 AM        ASSESSMENT AND PLAN:   1.  Cardiac murmur: I will order a 2-D echocardiogram with Doppler to evaluate cardiac structure, function, and regional wall motion.  2.  CHF (unknown etiology at this point): I will order a 2-D echocardiogram with Doppler to evaluate cardiac structure, function, and regional wall motion.  Currently not on a  diuretic.  He may have significant diastolic dysfunction given his long-standing, poorly controlled hypertension.  I will [facility] review.  3.  Hypertension: Blood pressure is markedly elevated.  He is on amlodipine 5 mg daily which was initiated within the past few days.  This will need  further monitoring.  Given his concrement and history of type 2 diabetes mellitus, I would recommend switching amlodipine to an ACE inhibitor or angiotensin receptor blocker to attenuate any potential proteinuria.  4.  Poorly controlled type 2 diabetes mellitus: Hemoglobin A1c is elevated at 10.3% as noted above.  He requires statin therapy.  Lipids are reviewed above.  I will start Crestor 10 mg daily.  His PCP plans to discuss diabetic management in the near future.  He certainly needs weight loss.  5.  Mixed dyslipidemia: Lipids reviewed above.  He has type 2 diabetes mellitus and requires statin therapy based on current guidelines.  I will start Crestor 10 mg.  Lipids should be repeated in 3 months.  6.  Chest pain and shortness of breath: He has several cardiovascular risk factors including a family history of coronary artery disease.  I will obtain CT coronary angiography.  I will request a copy of ECG from [facility] for personal review.  7.  Tobacco abuse: Cessation counseling provided (3 minutes).  He has smoked a pack of cigarettes daily since the age of 55 but has recently cut back to 4 to 5 cigarettes daily.    Disposition: Follow up in 10 weeks.   Signed: Prentice Docker, M.D., F.A.C.C.  01/22/2018, 5:16 PM

## 2018-01-22 NOTE — Patient Instructions (Addendum)
Your physician recommends that you schedule a follow-up appointment in: 10-12 WEEKS WITH DR KONESWARAN  Your physician has recommended you make the following change in your medication:   START CRESTOR 5 MG DAILY  Your physician has requested that you have an echocardiogram. Echocardiography is a [facility] test that uses sound waves to create images of your heart. It provides your doctor with information about the size and shape of your heart and how well your heart's chambers and valves are working. This procedure takes approximately one hour. There are no restrictions for this procedure.  Please [facility] main entrance of [facility] at    [facility] [address] [place], [place] [address] [phone]  Proceed to the [facility] Radiology Department (First Floor).  Please follow these instructions carefully (unless otherwise directed):  Hold all erectile dysfunction medications at least 48 hours prior to test.  On the Night Before the Test: . Drink plenty of water. . Do not consume any caffeinated/decaffeinated beverages or chocolate 12 hours prior to your test. . Do not take any antihistamines 12 hours prior to your test. . If you take Metformin do not take 24 hours prior to test.  On the Day of the Test: . Drink plenty of water. Do not drink any water within one hour of the test. . Do not eat any food 4 hours prior to the test. . You may take your regular medications prior to the test. . IF NOT ON A BETA BLOCKER - Take 50 mg of lopressor (metoprolol) one hour before the test. . HOLD Furosemide morning of the test.  After the Test: . Drink plenty of water. . After receiving IV contrast, you may experience a mild flushed feeling. This is normal. . On occasion, you may experience a mild rash up to 24 hours after the test. This is not dangerous. If this occurs, you can take Benadryl 25 mg and increase your fluid intake. . If you experience  trouble breathing, this can be serious. If it is severe call 911 IMMEDIATELY. If it is mild, please call our office. . If you take any of these medications: Glipizide/Metformin, Avandament, Glucavance, please do not take 48 hours after completing test.

## 2018-01-23 ENCOUNTER — Telehealth: Payer: Self-pay | Admitting: Cardiovascular Disease

## 2018-01-23 ENCOUNTER — Other Ambulatory Visit: Payer: Self-pay | Admitting: *Deleted

## 2018-01-23 DIAGNOSIS — R011 Cardiac murmur, unspecified: Secondary | ICD-10-CM

## 2018-01-23 NOTE — Telephone Encounter (Signed)
Pre-cert Verification for the following procedure   [facility]

## 2018-01-28 ENCOUNTER — Ambulatory Visit (HOSPITAL_COMMUNITY)
Admission: RE | Admit: 2018-01-28 | Discharge: 2018-01-28 | Disposition: A | Discharge status: Home / Self Care | Payer: Medicaid Other | Source: Ambulatory Visit | Attending: Cardiovascular Disease | Admitting: Cardiovascular Disease

## 2018-01-28 DIAGNOSIS — E119 Type 2 diabetes mellitus without complications: Secondary | ICD-10-CM | POA: Insufficient documentation

## 2018-01-28 DIAGNOSIS — R011 Cardiac murmur, unspecified: Secondary | ICD-10-CM | POA: Diagnosis not present

## 2018-01-28 DIAGNOSIS — R079 Chest pain, unspecified: Secondary | ICD-10-CM | POA: Diagnosis not present

## 2018-01-28 DIAGNOSIS — R0609 Other forms of dyspnea: Secondary | ICD-10-CM | POA: Diagnosis not present

## 2018-01-28 DIAGNOSIS — I509 Heart failure, unspecified: Secondary | ICD-10-CM | POA: Diagnosis not present

## 2018-01-28 DIAGNOSIS — E785 Hyperlipidemia, unspecified: Secondary | ICD-10-CM | POA: Insufficient documentation

## 2018-01-28 DIAGNOSIS — I110 Hypertensive heart disease with heart failure: Secondary | ICD-10-CM | POA: Insufficient documentation

## 2018-01-28 DIAGNOSIS — Z720 Tobacco use: Secondary | ICD-10-CM | POA: Diagnosis not present

## 2018-01-28 DIAGNOSIS — E669 Obesity, unspecified: Secondary | ICD-10-CM | POA: Insufficient documentation

## 2018-01-28 DIAGNOSIS — M7989 Other specified soft tissue disorders: Secondary | ICD-10-CM | POA: Insufficient documentation

## 2018-01-28 NOTE — Progress Notes (Signed)
*  PRELIMINARY RESULTS* Echocardiogram 2D Echocardiogram has been performed.  Stacey DrainWhite, Kharee Lesesne J 01/28/2018, 3:52 PM

## 2018-01-29 ENCOUNTER — Telehealth: Payer: Self-pay | Admitting: Cardiovascular Disease

## 2018-01-29 NOTE — Telephone Encounter (Signed)
Patient called requesting test results of recent echo.  °

## 2018-01-30 NOTE — Telephone Encounter (Signed)
Notes recorded by Lesle ChrisHill, Angela G, LPN on 1/6/10967/09/2017 at 10:31 AM EDT Patient notified. Copy to pmd. Follow up scheduled for September with Dr. Purvis SheffieldKoneswaran in [place] office.   ------  Notes recorded by Lesle ChrisHill, Angela G, LPN on 0/4/54097/09/2017 at 10:01 AM EDT Left message with fiance for call back from patient. No DPR in chart. ------  Notes recorded by Laqueta LindenKoneswaran, Suresh A, MD on 01/29/2018 at 8:54 AM EDT Normal cardiac function. No significant valve leakage.

## 2018-01-30 NOTE — Telephone Encounter (Signed)
Left message with fiance for a return call from patient - no designated release in chart.

## 2018-03-01 ENCOUNTER — Encounter (HOSPITAL_COMMUNITY): Payer: Self-pay | Admitting: Emergency Medicine

## 2018-03-01 ENCOUNTER — Emergency Department (HOSPITAL_COMMUNITY): Payer: Medicaid Other

## 2018-03-01 ENCOUNTER — Emergency Department (HOSPITAL_COMMUNITY)
Admission: EM | Admit: 2018-03-01 | Discharge: 2018-03-01 | Disposition: A | Discharge status: Home / Self Care | Payer: Medicaid Other | Attending: Emergency Medicine | Admitting: Emergency Medicine

## 2018-03-01 ENCOUNTER — Other Ambulatory Visit: Payer: Self-pay

## 2018-03-01 DIAGNOSIS — E119 Type 2 diabetes mellitus without complications: Secondary | ICD-10-CM | POA: Insufficient documentation

## 2018-03-01 DIAGNOSIS — R42 Dizziness and giddiness: Secondary | ICD-10-CM | POA: Insufficient documentation

## 2018-03-01 DIAGNOSIS — F17210 Nicotine dependence, cigarettes, uncomplicated: Secondary | ICD-10-CM | POA: Diagnosis not present

## 2018-03-01 DIAGNOSIS — Z79899 Other long term (current) drug therapy: Secondary | ICD-10-CM | POA: Insufficient documentation

## 2018-03-01 DIAGNOSIS — Z7984 Long term (current) use of oral hypoglycemic drugs: Secondary | ICD-10-CM | POA: Insufficient documentation

## 2018-03-01 DIAGNOSIS — I110 Hypertensive heart disease with heart failure: Secondary | ICD-10-CM | POA: Insufficient documentation

## 2018-03-01 DIAGNOSIS — I509 Heart failure, unspecified: Secondary | ICD-10-CM | POA: Insufficient documentation

## 2018-03-01 HISTORY — DX: Type 2 diabetes mellitus without complications: E11.9

## 2018-03-01 HISTORY — DX: Essential (primary) hypertension: I10

## 2018-03-01 HISTORY — DX: Heart failure, unspecified: I50.9

## 2018-03-01 MED ORDER — HYDROGEN PEROXIDE 3 % EX SOLN
CUTANEOUS | Status: AC
  Filled 2018-03-01: qty 473

## 2018-03-01 NOTE — ED Triage Notes (Signed)
Dizziness x 3 days.  Seen at ED tue dx of vertigo, also seen at pcp yesterday and told to take meclizine, if dizziness gets worse come to ED.  Pt states medication is not working.

## 2018-03-01 NOTE — Discharge Instructions (Addendum)
The MRI of your brain does not show any concerning findings (ie nothing that might explain your vertigo or is otherwise concerning).   Continue to take meclizine as needed. Try Epley maneuvers at home. Instructions on how to do them are included with your discharge paperwork.

## 2018-03-01 NOTE — ED Notes (Signed)
Pt taken to MRI. Nad.

## 2018-03-06 NOTE — ED Provider Notes (Signed)
[facility] EMERGENCY DEPARTMENT Provider Note   CSN: [id number] Arrival date & time: 03/01/18  1030     History   Chief Complaint Chief Complaint  Patient presents with  . Dizziness    HPI Loralie ChampagneCarl R Dam is a 37 y.o. male.  HPI   37 year old male with dizziness.  States he was recently seen for the same complaints and diagnosed with vertigo.  Seen by his PCP P as well yesterday prescribed meclizine.  He has tried taking it without improvement.  He describes sensation of feeling very off balance and unsteady.  Minimal symptoms at rest.  Much worse with movement.  Denies any tenderness or ear fullness.  No acute visual complaints, change in speech, numbness or loss of strength.  Denies any headaches.  Past Medical History:  Diagnosis Date  . CHF (congestive heart failure) (HCC)   . Diabetes mellitus without complication (HCC)   . Hypertension     There are no active problems to display for this patient.   History reviewed. No pertinent surgical history.      Home Medications    Prior to Admission medications   Medication Sig Start Date End Date Taking? Authorizing Provider  amLODipine (NORVASC) 10 MG tablet Take 10 mg by mouth daily.    Yes [provider]  glipiZIDE (GLUCOTROL XL) 10 MG 24 hr tablet Take 1 tablet by mouth 2 (two) times daily. 01/23/18  Yes [provider]  hydrochlorothiazide (HYDRODIURIL) 25 MG tablet Take 1 tablet by mouth daily. 12/03/17  Yes [provider]  losartan (COZAAR) 25 MG tablet Take 1 tablet by mouth daily. 02/20/18 02/20/19 Yes [provider]  rosuvastatin (CRESTOR) 10 MG tablet Take 1 tablet (10 mg total) by mouth daily. 01/22/18 04/22/18 Yes Laqueta LindenKoneswaran, Suresh A, MD  Exenatide ER (BYDUREON) 2 MG PEN Inject 2 mg into the skin once a week. 02/20/18   [provider]    Family History No family history on file.  Social History Social History   Tobacco Use  . Smoking status: Current Every Day  Smoker    Packs/day: 1.00    Types: Cigarettes  . Smokeless tobacco: Never Used  Substance Use Topics  . Alcohol use: Yes    Comment: occ  . Drug use: Not Currently     Allergies   Patient has no known allergies.   Review of Systems Review of Systems   Physical Exam Updated Vital Signs BP 133/72 (BP Location: Right Arm)   Pulse 97   Temp 97.8 F (36.6 C) (Oral)   Resp 20   Ht 5\' 9"  (1.753 m)   Wt 131.5 kg (290 lb)   SpO2 97%   BMI 42.83 kg/m   Physical Exam  Constitutional: He is oriented to person, place, and time. He appears well-developed and well-nourished. No distress.  HENT:  Head: Normocephalic and atraumatic.  Right Ear: External ear normal.  Left Ear: External ear normal.  Eyes: Pupils are equal, round, and reactive to light. Conjunctivae and EOM are normal. Right eye exhibits no discharge. Left eye exhibits no discharge.  Neck: Neck supple.  Cardiovascular: Normal rate, regular rhythm and normal heart sounds. Exam reveals no gallop and no friction rub.  No murmur heard. Pulmonary/Chest: Effort normal and breath sounds normal. No respiratory distress.  Abdominal: Soft. He exhibits no distension. There is no tenderness.  Musculoskeletal: He exhibits no edema or tenderness.  Neurological: He is alert and oriented to person, place, and time. No cranial nerve  deficit. He exhibits normal muscle tone. Coordination normal.  Good finger-nose testing bilaterally.  Good heel-to-shin bilaterally.  Skin: Skin is warm and dry.  Psychiatric: He has a normal mood and affect. His behavior is normal. Thought content normal.  Nursing note and vitals reviewed.    ED Treatments / Results  Labs (all labs ordered are listed, but only abnormal results are displayed) Labs Reviewed - No data to display  EKG EKG Interpretation  Date/Time:  Friday March 01 2018 10:38:11 EDT Ventricular Rate:  106 PR Interval:    QRS Duration: 101 QT Interval:  335 QTC  Calculation: 445 R Axis:   -14 Text Interpretation:  Sinus tachycardia Confirmed by Raeford Razor [phone]) on 03/01/2018 11:01:01 AM   Radiology No results found.   Mr Brain Wo Contrast  Result Date: 03/01/2018 CLINICAL DATA:  Dizziness for 3 days.  Vertigo, persistent, central. EXAM: MRI HEAD WITHOUT CONTRAST TECHNIQUE: Multiplanar, multiecho pulse sequences of the brain and surrounding structures were obtained without intravenous contrast. COMPARISON:  Head CT 05/07/2008 FINDINGS: Brain: No acute infarction, hemorrhage, hydrocephalus, extra-axial collection or mass lesion. Between 10 and 20 small FLAIR hyperintense foci in the bilateral cerebral white matter, mainly in the frontal lobes. These are nonspecific but may be from chronic microvascular ischemia given patient's vascular risk factors. No specific demyelinating pattern. Normal brain volume Vascular: Major flow voids are preserved Skull and upper cervical spine: Narrow appearance of the foramen magnum without tonsillar herniation or cervicomedullary impingement. Sinuses/Orbits: Mucous retention cysts in the inferior left maxillary sinus IMPRESSION: 1. No acute finding or explanation for symptoms. 2. Small signal abnormalities in the cerebral white matter to a mild degree. These have a nonspecific pattern, history favors early chronic microvascular ischemia. Electronically Signed   By: Marnee Spring M.D.   On: 03/01/2018 12:28    Procedures Procedures (including critical care time)  Medications Ordered in ED Medications - No data to display   Initial Impression / Assessment and Plan / ED Course  I have reviewed the triage vital signs and the nursing notes.  Pertinent labs & imaging results that were available during my care of the patient were reviewed by me and considered in my medical decision making (see chart for details).     37 year old male with persistent vertigo.  No particularly concerning features.  Neuro exam is  nonfocal.  Suspect peripheral etiology.  Consider central causes of stroke, but doubt. MRI w/o acute explanatory pathology.  Continue PRN meclizine.  He was given description of Epley maneuvers to do at home.  Emergent return precautions were discussed.  Final Clinical Impressions(s) / ED Diagnoses   Final diagnoses:  Vertigo    ED Discharge Orders    None       Raeford Razor, MD 03/06/18 1459

## 2018-03-07 ENCOUNTER — Telehealth: Payer: Self-pay | Admitting: *Deleted

## 2018-03-07 ENCOUNTER — Other Ambulatory Visit: Payer: Self-pay | Admitting: *Deleted

## 2018-03-07 DIAGNOSIS — I10 Essential (primary) hypertension: Secondary | ICD-10-CM

## 2018-03-07 NOTE — Telephone Encounter (Signed)
Pt will have labs done prior to coronary CT - mailed lab orders to pt home as requested

## 2018-03-15 ENCOUNTER — Ambulatory Visit (HOSPITAL_COMMUNITY): Payer: Medicaid Other

## 2018-04-15 ENCOUNTER — Ambulatory Visit: Payer: Medicaid Other | Admitting: Cardiovascular Disease

## 2018-04-16 ENCOUNTER — Encounter: Payer: Self-pay | Admitting: Cardiovascular Disease

## 2018-05-07 ENCOUNTER — Encounter: Payer: Self-pay | Admitting: *Deleted

## 2018-08-29 ENCOUNTER — Other Ambulatory Visit: Payer: Self-pay | Admitting: Cardiovascular Disease

## 2021-02-16 ENCOUNTER — Other Ambulatory Visit (INDEPENDENT_AMBULATORY_CARE_PROVIDER_SITE_OTHER): Payer: 59

## 2021-02-16 ENCOUNTER — Ambulatory Visit (INDEPENDENT_AMBULATORY_CARE_PROVIDER_SITE_OTHER): Payer: 59 | Admitting: Physician Assistant

## 2021-02-16 ENCOUNTER — Encounter: Payer: Self-pay | Admitting: Physician Assistant

## 2021-02-16 VITALS — BP 140/88 | HR 100 | Ht 69.0 in | Wt 259.0 lb

## 2021-02-16 DIAGNOSIS — R160 Hepatomegaly, not elsewhere classified: Secondary | ICD-10-CM | POA: Diagnosis not present

## 2021-02-16 DIAGNOSIS — K760 Fatty (change of) liver, not elsewhere classified: Secondary | ICD-10-CM

## 2021-02-16 DIAGNOSIS — R1011 Right upper quadrant pain: Secondary | ICD-10-CM

## 2021-02-16 LAB — CBC WITH DIFFERENTIAL/PLATELET
Basophils Absolute: 0.1 10*3/uL (ref 0.0–0.1)
Basophils Relative: 1 % (ref 0.0–3.0)
Eosinophils Absolute: 0.2 10*3/uL (ref 0.0–0.7)
Eosinophils Relative: 1.5 % (ref 0.0–5.0)
HCT: 50.2 % (ref 39.0–52.0)
Hemoglobin: 17.5 g/dL — ABNORMAL HIGH (ref 13.0–17.0)
Lymphocytes Relative: 32.4 % (ref 12.0–46.0)
Lymphs Abs: 4 10*3/uL (ref 0.7–4.0)
MCHC: 34.8 g/dL (ref 30.0–36.0)
MCV: 87.4 fl (ref 78.0–100.0)
Monocytes Absolute: 1.5 10*3/uL — ABNORMAL HIGH (ref 0.1–1.0)
Monocytes Relative: 11.8 % (ref 3.0–12.0)
Neutro Abs: 6.6 10*3/uL (ref 1.4–7.7)
Neutrophils Relative %: 53.3 % (ref 43.0–77.0)
Platelets: 234 10*3/uL (ref 150.0–400.0)
RBC: 5.74 Mil/uL (ref 4.22–5.81)
RDW: 12.8 % (ref 11.5–15.5)
WBC: 12.1 10*3/uL — ABNORMAL HIGH (ref 4.0–10.5)

## 2021-02-16 LAB — COMPREHENSIVE METABOLIC PANEL
ALT: 58 U/L — ABNORMAL HIGH (ref 0–53)
AST: 57 U/L — ABNORMAL HIGH (ref 0–37)
Albumin: 4.4 g/dL (ref 3.5–5.2)
Alkaline Phosphatase: 58 U/L (ref 39–117)
BUN: 15 mg/dL (ref 6–23)
CO2: 29 mEq/L (ref 19–32)
Calcium: 10.2 mg/dL (ref 8.4–10.5)
Chloride: 94 mEq/L — ABNORMAL LOW (ref 96–112)
Creatinine, Ser: 0.77 mg/dL (ref 0.40–1.50)
GFR: 113.3 mL/min (ref >60.00)
Glucose, Bld: 126 mg/dL — ABNORMAL HIGH (ref 70–99)
Potassium: 3.3 mEq/L — ABNORMAL LOW (ref 3.5–5.1)
Sodium: 135 mEq/L (ref 135–145)
Total Bilirubin: 0.5 mg/dL (ref 0.2–1.2)
Total Protein: 8.2 g/dL (ref 6.0–8.3)

## 2021-02-16 LAB — PROTIME-INR
INR: 1.1 ratio — ABNORMAL HIGH (ref 0.8–1.0)
Prothrombin Time: 12.2 s (ref 9.6–13.1)

## 2021-02-16 NOTE — Progress Notes (Signed)
Attending Physician's Attestation   I have reviewed the chart.   I agree with the Advanced Practitioner's note, impression, and recommendations with any updates as below. Agree with further work-up/serologies. Consideration after follow-up imaging at some point undergo elastography may be worthwhile. Ensuring follow-up in the next 6 to 10 weeks will be helpful as well.   Corliss Parish, MD [facility] Advanced Endoscopy Office # [phone]

## 2021-02-16 NOTE — Patient Instructions (Signed)
Your provider has requested that you go to the basement level for lab work before leaving today. Press "B" on the elevator. The lab is located at the first door on the left as you exit the elevator.  You have been scheduled for an abdominal ultrasound at [facility] (1st floor of hospital) on Thursday 02/24/21 at 10:30 am. Please arrive 15 minutes prior to your appointment for registration. Make certain not to have anything to eat or drink 6 hours prior to your appointment. Should you need to reschedule your appointment, please contact radiology at [phone]. This test typically takes about 30 minutes to perform.  If you are age 53 or older, your body mass index should be between 23-30. Your Body mass index is 38.25 kg/m. If this is out of the aforementioned range listed, please consider follow up with your Primary Care Provider.  If you are age 24 or younger, your body mass index should be between 19-25. Your Body mass index is 38.25 kg/m. If this is out of the aformentioned range listed, please consider follow up with your Primary Care Provider.   __________________________________________________________  The [place] [facility] providers would like to encourage you to use [facility] to communicate with providers for non-urgent requests or questions.  Due to long hold times on the telephone, sending your provider a message by [facility] may be a faster and more efficient way to get a response.  Please allow 48 business hours for a response.  Please remember that this is for non-urgent requests.

## 2021-02-16 NOTE — Progress Notes (Signed)
Chief Complaint: Fatty liver and hepatomegaly  HPI:    Micheal Zavala is a 40 year old Caucasian male with a past medical history of CHF (01/28/2018 echo with an LVEF 55-60%) and others listed below, who was referred to me by Domenick Bookbinder, PA-C for a complaint of fatty liver and hepatomegaly.      01/28/2021 patient seen in the [facility] ED for epigastric and right upper quadrant pain radiating to his back for several days, this was sometimes worse with eating.  He had a lipase which was minimally elevated but CT did not show evidence of acute pancreatitis.  He was started on a PPI for gastritis.  CMP was normal, lipase 374, CBC normal.  CT abdomen pelvis with contrast showed diffuse hepatic hypoattenuation compatible with hepatic steatosis, no concern for focal liver lesion.  Normal gallbladder and biliary tree.  He had changes in lumbar spine with some resulting mild to moderate canal stenosis and foraminal narrowing.  Ventral rectus diastases, bilateral fat-containing inguinal hernias.    02/04/2021 right upper quadrant ultrasound with liver enlarged measuring 25 cm, diffusely increased in echotexture, tiny 5 mm hypoechoic left hepatic lesion likely a small cyst.  No other abnormalities.      Today, the patient presents to clinic and explains that in mid June he started with a pain in his back towards the right side, this seemed to move towards the front of his abdomen and made it very sensitive to the touch in his right upper quadrant.  This has continued since then and he notices that this pain is constant throughout the day but may fluctuate in intensity, specifically worse if he leans on something or pushes on this area, "even lightly".  He rates this sharp pain as a constant 5-12/2008 which again can get worse at times.  Initially when all this started he had a slight change in his bowel habits noting that he typically used to have 2-3 bowel movements a day and it decreased to 1 or 1 every other day, but  this has since gone back to normal.      Tells me that for fear of worsening liver disease he has gone back on his diet, he has hypertension and diabetes chronically and had not been eating the best.  Describes that by eating healthy he has been able to lose over 40 pounds over the past year.      Does describe that he used to be an every other weekend alcohol drinker, but he stopped this completely.  He did get a tattoo out of someone's house when he was 40 years old and otherwise has no family history of liver disease, IV drug use or hepatitis.    Denies fever, chills, or worsening fatigue, nausea, vomiting, heartburn, reflux or symptoms that awaken him from sleep.  Past Medical History:  Diagnosis Date   CHF (congestive heart failure) (HCC)    Diabetes mellitus without complication (HCC)    Hypertension     Surgical history: None  Current Outpatient Medications  Medication Sig Dispense Refill   amLODipine (NORVASC) 10 MG tablet Take 10 mg by mouth daily.      Exenatide ER (BYDUREON) 2 MG PEN Inject 2 mg into the skin once a week.     glipiZIDE (GLUCOTROL XL) 10 MG 24 hr tablet Take 1 tablet by mouth 2 (two) times daily.     hydrochlorothiazide (HYDRODIURIL) 25 MG tablet Take 1 tablet by mouth daily.     losartan (COZAAR)  25 MG tablet Take 1 tablet by mouth daily.     rosuvastatin (CRESTOR) 10 MG tablet TAKE 1 TABLET BY MOUTH ONCE DAILY. 15 tablet 0   No current facility-administered medications for this visit.    Allergies as of 02/16/2021   (No Known Allergies)    No family history on file.  Social History   Socioeconomic History   Marital status: Single    Spouse name: Not on file   Number of children: Not on file   Years of education: Not on file   Highest education level: Not on file  Occupational History   Not on file  Tobacco Use   Smoking status: Every Day    Packs/day: 1.00    Types: Cigarettes   Smokeless tobacco: Never  Substance and Sexual Activity    Alcohol use: Yes    Comment: occ   Drug use: Not Currently   Sexual activity: Not on file  Other Topics Concern   Not on file  Social History Narrative   Not on file   Social Determinants of Health   Financial Resource Strain: Not on file  Food Insecurity: Not on file  Transportation Needs: Not on file  Physical Activity: Not on file  Stress: Not on file  Social Connections: Not on file  Intimate Partner Violence: Not on file    Review of Systems:    Constitutional: No fever or chills Skin: No rash  Cardiovascular: No chest pain Respiratory: No SOB Gastrointestinal: See HPI and otherwise negative Genitourinary: No dysuria  Neurological: No headache, dizziness or syncope Musculoskeletal: No new muscle or joint pain Hematologic: No bleeding Psychiatric: No history of depression or anxiety   Physical Exam:  Vital signs: BP 140/88   Pulse 100   Ht 5\' 9"  (1.753 m)   Wt 259 lb (117.5 kg)   BMI 38.25 kg/m    Constitutional:   Pleasant obese Caucasian male appears to be in NAD, Well developed, Well nourished, alert and cooperative Head:  Normocephalic and atraumatic. Eyes:   PEERL, EOMI. No icterus. Conjunctiva pink. Ears:  Normal auditory acuity. Neck:  Supple Throat: Oral cavity and pharynx without inflammation, swelling or lesion.  Respiratory: Respirations even and unlabored. Lungs clear to auscultation bilaterally.   No wheezes, crackles, or rhonchi.  Cardiovascular: Normal S1, S2. No MRG. Regular rate and rhythm. No peripheral edema, cyanosis or pallor.  Gastrointestinal:  Soft, nondistended, moderate TTP in the right upper quadrant with light palpation, no tenderness with deep palpation no rebound or guarding. Normal bowel sounds. No appreciable masses or hepatomegaly. Rectal:  Not performed.  Msk:  Symmetrical without gross deformities. Without edema, no deformity or joint abnormality.  Neurologic:  Alert and  oriented x4;  grossly normal neurologically.  Skin:    Dry and intact without significant lesions or rashes. Psychiatric: Demonstrates good judgement and reason without abnormal affect or behaviors.  See HPI for recent labs/imaging.  Assessment: 1.  Hepatomegaly: See HPI for recent imaging showing hepatomegaly and fatty liver, recent CMP and CBC normal, patient is tender in the right upper quadrant but only to very light palpation (question abdominal wall pain versus relation to hepatomegaly), no other sequelae of liver disease; consider derangement and portal blood flow versus less likely amyloidosis with hepatic involvement versus hepatitis versus other 2.  Fatty liver 3.  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  Discussed with patient that the good thing is that he has had recent CT and ultrasound and neither of them showed cancer which he has been worried about. 2.  Ordered further labs today to include a CBC, CMP, PT/INR, hepatitis labs and AFP 3.  Ordered repeat right upper quadrant ultrasound with Doppler. 4.  Encouraged the patient to continue his diet and weight loss for fatty liver. 5.  Patient to follow in clinic per recommendations after labs and imaging above.  He was assigned to Dr. Meridee Score this morning.  Hyacinth Meeker, PA-C [facility] 02/16/2021, 11:13 AM  Cc: Hess, Lanice Shirts, PA-C

## 2021-02-18 ENCOUNTER — Other Ambulatory Visit: Payer: Self-pay

## 2021-02-18 DIAGNOSIS — K760 Fatty (change of) liver, not elsewhere classified: Secondary | ICD-10-CM

## 2021-02-18 LAB — HEPATITIS PANEL, ACUTE
Hep A IgM: NONREACTIVE
Hep B C IgM: NONREACTIVE
Hepatitis B Surface Ag: NONREACTIVE
Hepatitis C Ab: NONREACTIVE
SIGNAL TO CUT-OFF: 0.02 (ref <1.00)

## 2021-02-18 LAB — TEST AUTHORIZATION

## 2021-02-18 LAB — AFP TUMOR MARKER: AFP-Tumor Marker: 3 ng/mL (ref <6.1)

## 2021-02-18 MED ORDER — POTASSIUM CHLORIDE CRYS ER 20 MEQ PO TBCR: 20.0000 meq | EXTENDED_RELEASE_TABLET | Freq: Once | ORAL | 0 refills | Status: AC

## 2021-02-24 ENCOUNTER — Other Ambulatory Visit: Payer: Self-pay

## 2021-02-24 ENCOUNTER — Ambulatory Visit (HOSPITAL_COMMUNITY)
Admission: RE | Admit: 2021-02-24 | Discharge: 2021-02-24 | Disposition: A | Discharge status: Home / Self Care | Payer: 59 | Source: Ambulatory Visit | Attending: Physician Assistant | Admitting: Physician Assistant

## 2021-02-24 DIAGNOSIS — R160 Hepatomegaly, not elsewhere classified: Secondary | ICD-10-CM | POA: Insufficient documentation

## 2021-02-24 DIAGNOSIS — R1011 Right upper quadrant pain: Secondary | ICD-10-CM | POA: Insufficient documentation

## 2021-02-24 DIAGNOSIS — K760 Fatty (change of) liver, not elsewhere classified: Secondary | ICD-10-CM | POA: Diagnosis present

## 2021-03-21 ENCOUNTER — Telehealth: Payer: Self-pay

## 2021-03-21 NOTE — Telephone Encounter (Signed)
-----   Message from Missy Sabins, RN sent at 03/02/2021 12:55 PM EDT ----- Regarding: Labs Recheck of CBC/CMP/PT/INR in 1 month. Orders in epic.

## 2021-03-21 NOTE — Telephone Encounter (Signed)
Spoke with patient to remind him that he is due for repeat labs at this time. No appointment is necessary. Patient is aware that he can stop by the lab in the basement at his convenience between 7:30 AM - 5 PM, Monday through Friday. Patient verbalized understanding and had no concerns at the end of the call.   

## 2023-03-03 IMAGING — US US HEPATIC LIVER DOPPLER
2 series · 13 of 25 positions shown · non-contrast
Comparison: Ultrasound abdomen 05/10/2008

CLINICAL DATA: Fatty liver disease

Right upper quadrant pain
Hepatomegaly
EXAM:
DUPLEX ULTRASOUND OF LIVER
TECHNIQUE: Color and duplex Doppler ultrasound was performed to evaluate the
hepatic in-flow and out-flow vessels.

[Series 1: us hepatic liver doppler · 11 of 44 slices shown (1 of 2)]
[im 1/44]
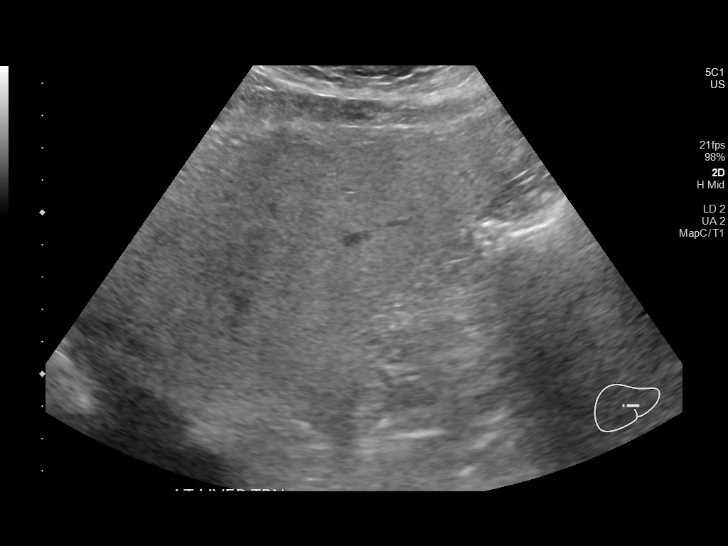
[im 5/44]
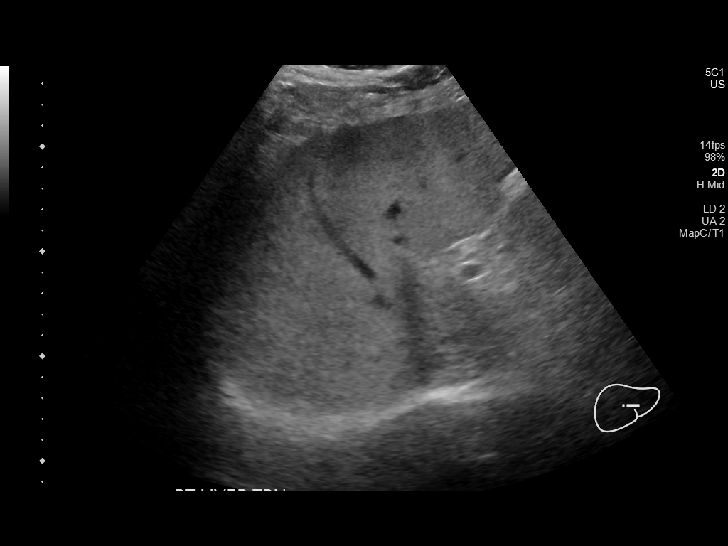
[im 9/44]
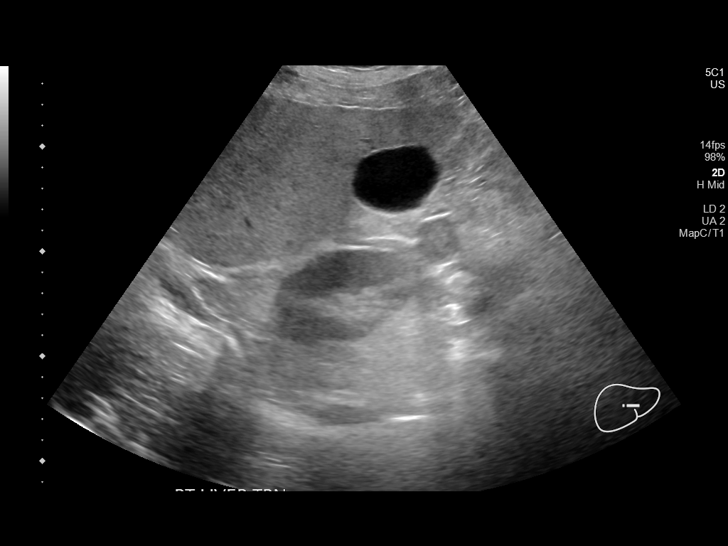
[im 13/44]
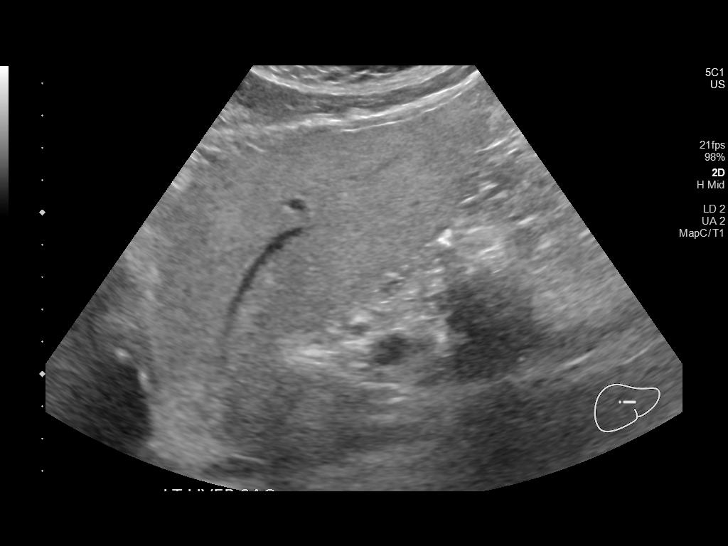
[im 17/44]
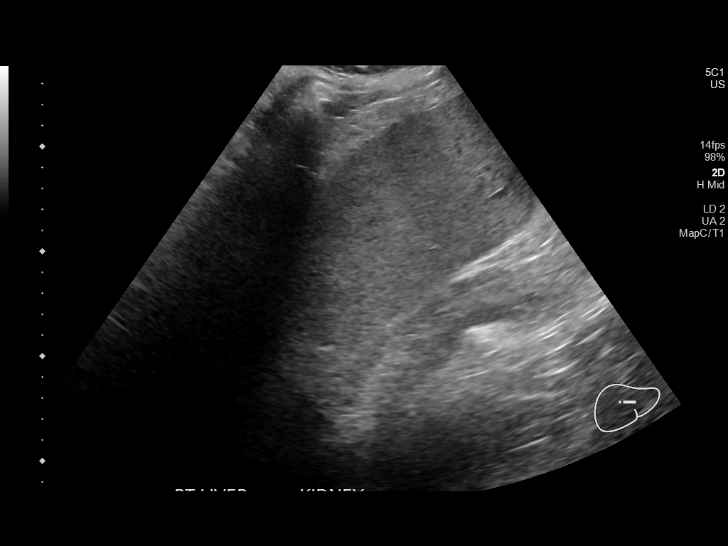
[im 21/44]
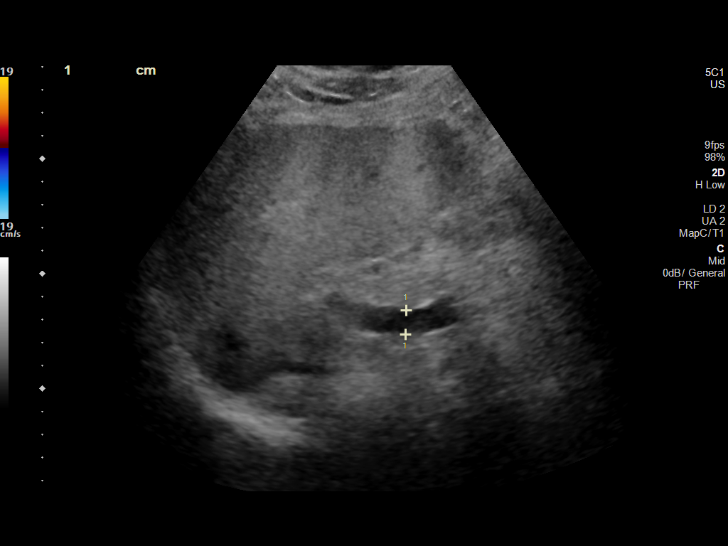
[im 25/44]
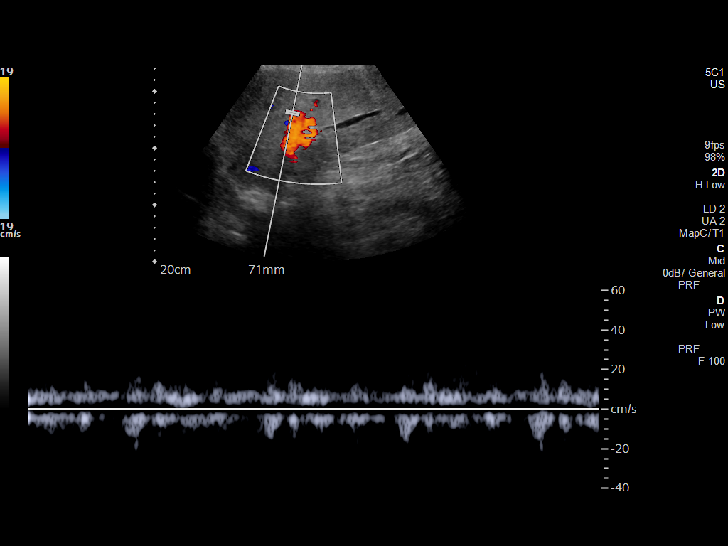
[im 29/44]
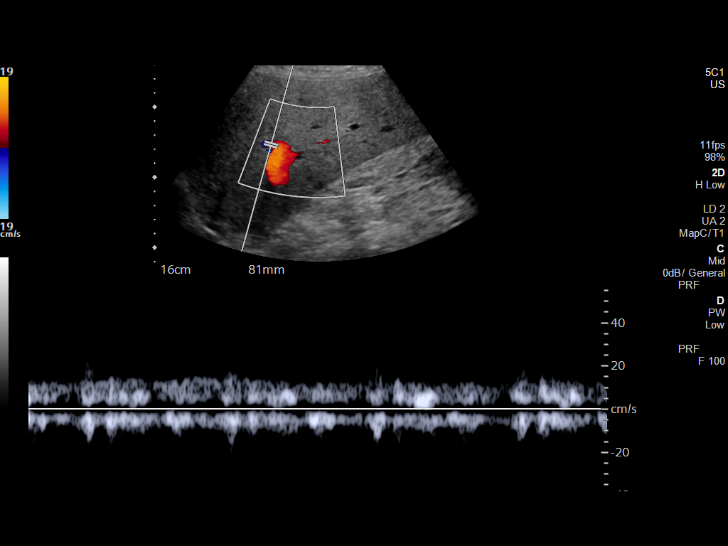
[im 33/44]
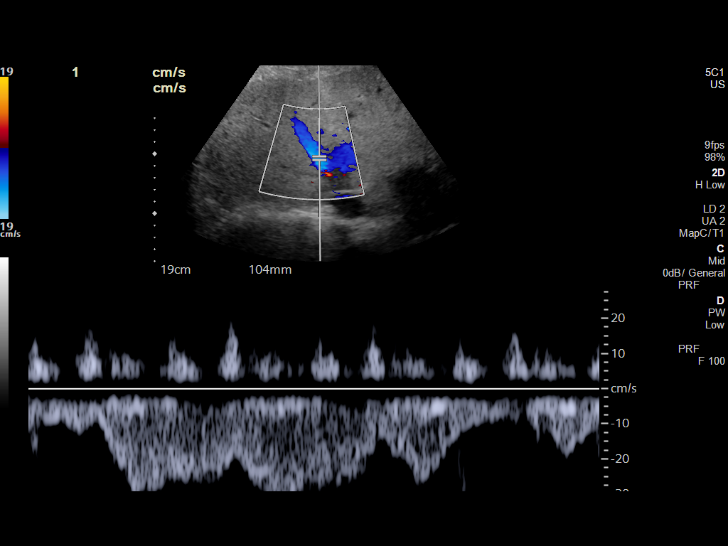
[im 37/44]
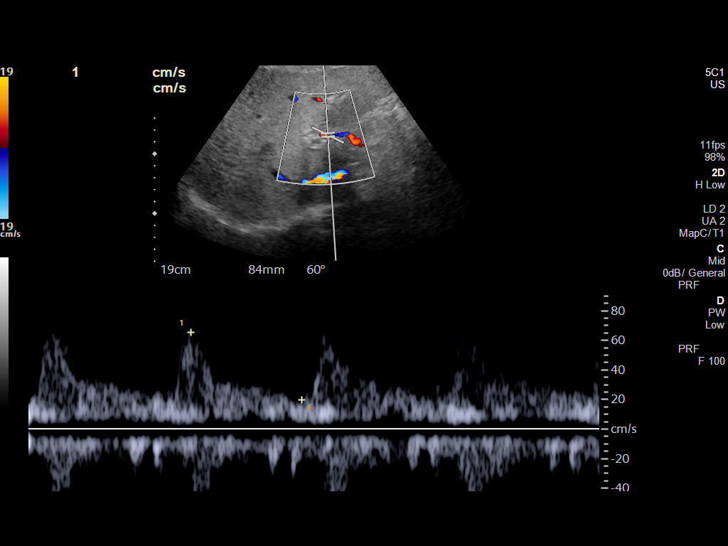
[im 41/44]
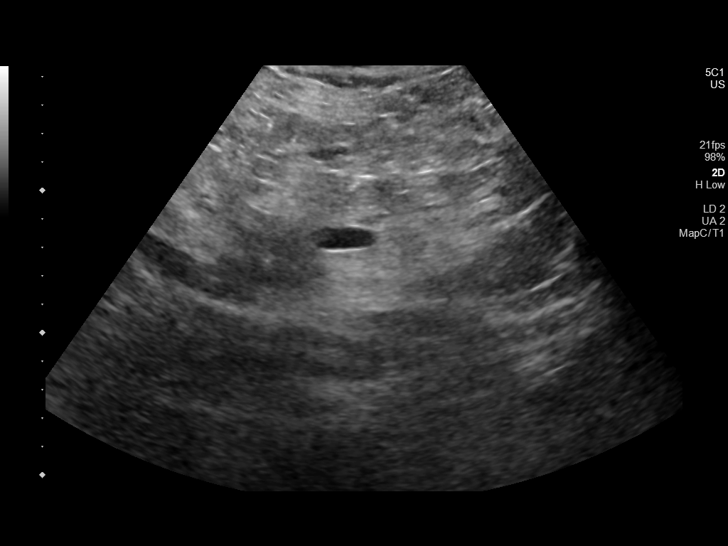

[Series 2: us hepatic liver doppler · 2 of 5 slices shown (2 of 2)]
[im 1/5]
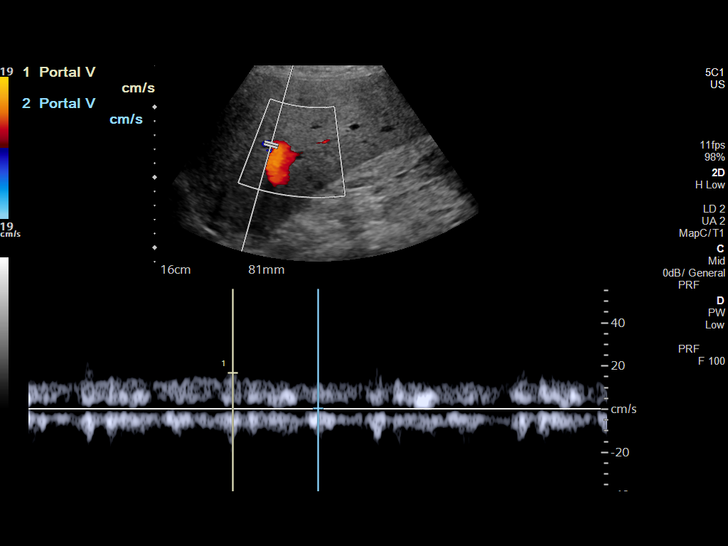
[im 5/5]
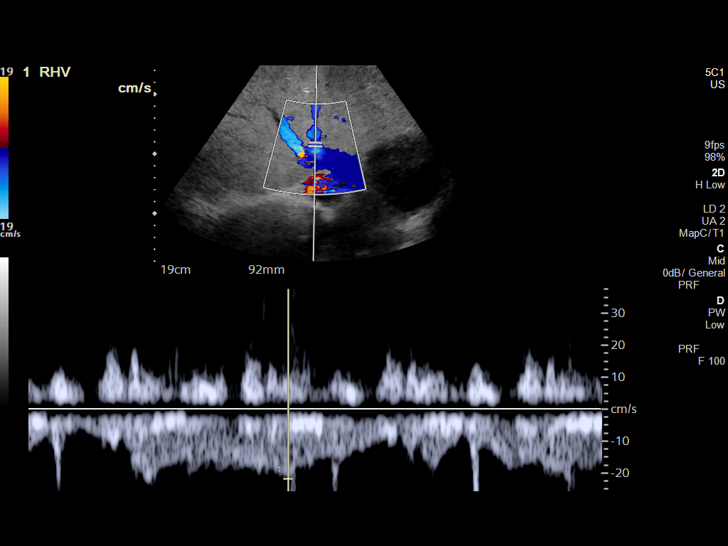

[13 of 25 positions shown; findings below may reference images not displayed]

FINDINGS: Liver:

No focal lesion.

Diffusely increased parenchymal echogenicity.

Normal hepatic contour without nodularity.

No focal lesion, mass or intrahepatic biliary ductal dilatation.

Main Portal Vein size: 1.1 cm

Portal Vein Velocities

Main:  17 cm/sec
Right: 16 cm/sec
Left: 14 cm/sec

Hepatic Vein Velocities

Right:  32 cm/sec

Middle:  32 cm/sec

Left:  22 cm/sec

IVC: Present and patent with normal respiratory phasicity.

Hepatic Artery Velocity:  65 cm/sec

Splenic Vein Velocity:  11 cm/sec

Spleen: Non-enlarged.

Portal Vein Occlusion/Thrombus: No

Splenic Vein Occlusion/Thrombus: No

Ascites: None

Varices: None
IMPRESSION: 1. Patent portal vein with appropriate direction of flow.
2. Diffuse increased echogenicity of the hepatic parenchyma is a
nonspecific indicator of hepatocellular dysfunction, most commonly
steatosis.

## 2023-03-03 IMAGING — US US ABDOMEN LIMITED
1 series · 14 of 25 positions shown · non-contrast
Comparison: Ultrasound abdomen 05/10/2008

CLINICAL DATA: Fatty liver

Hepatomegaly
Right upper quadrant pain
EXAM:
ULTRASOUND ABDOMEN LIMITED RIGHT UPPER QUADRANT

[Series 1: us abdomen limited · 14 of 41 slices shown]
[im 1/41]
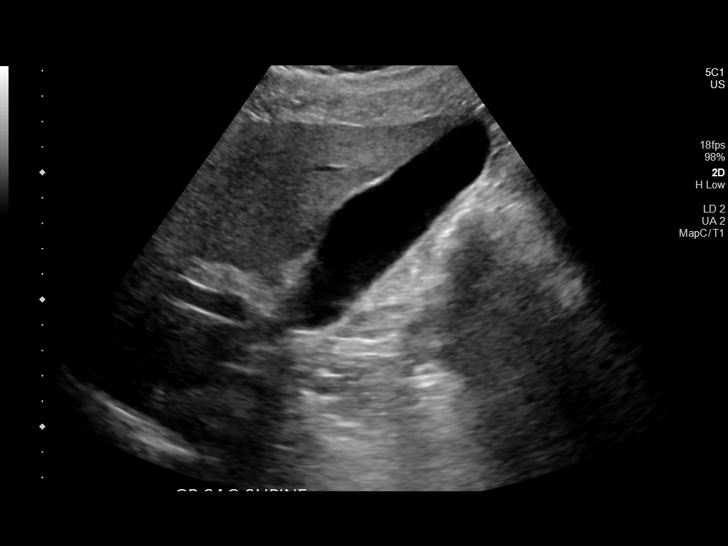
[im 4/41]
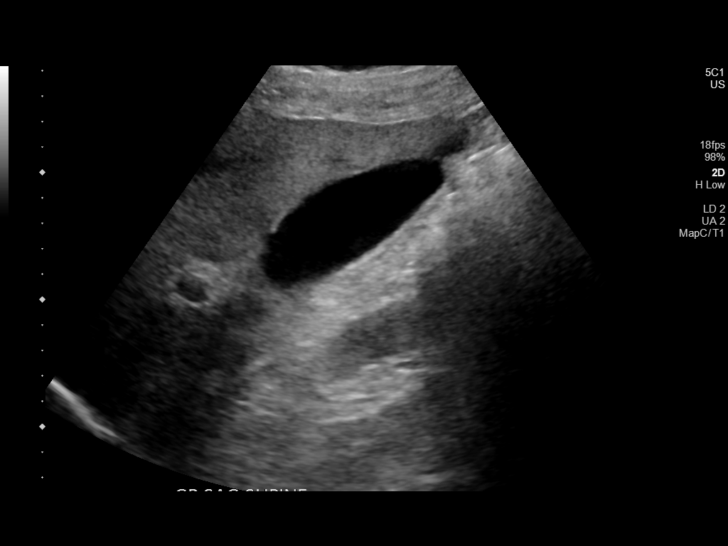
[im 7/41]
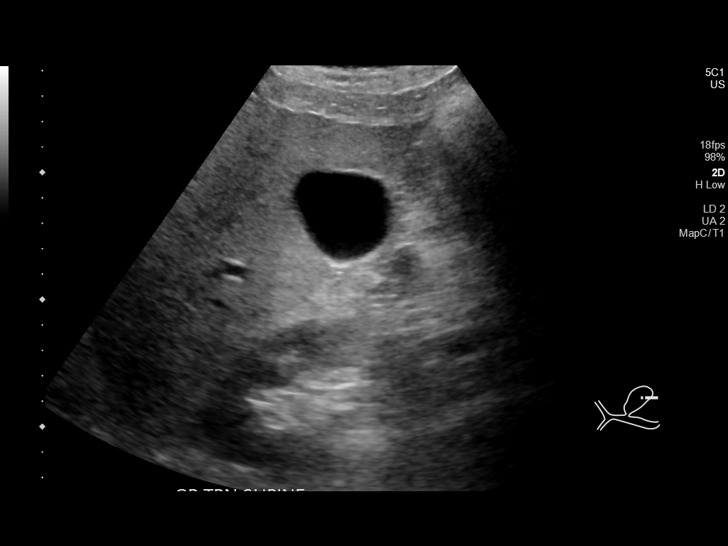
[im 11/41]
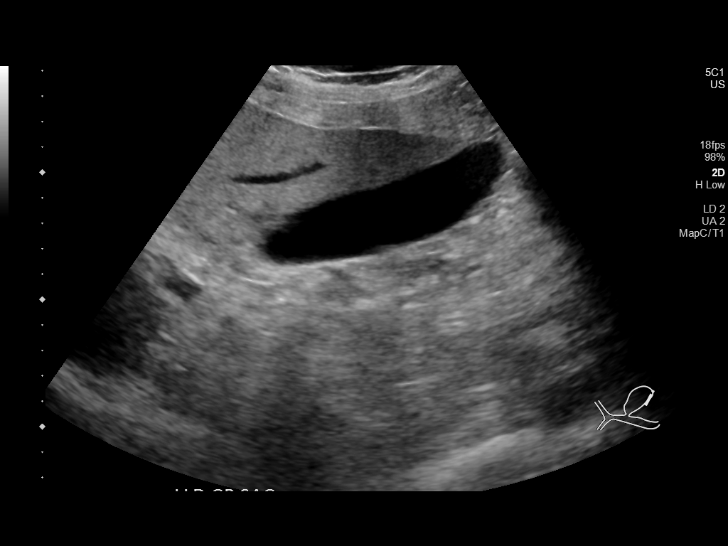
[im 14/41]
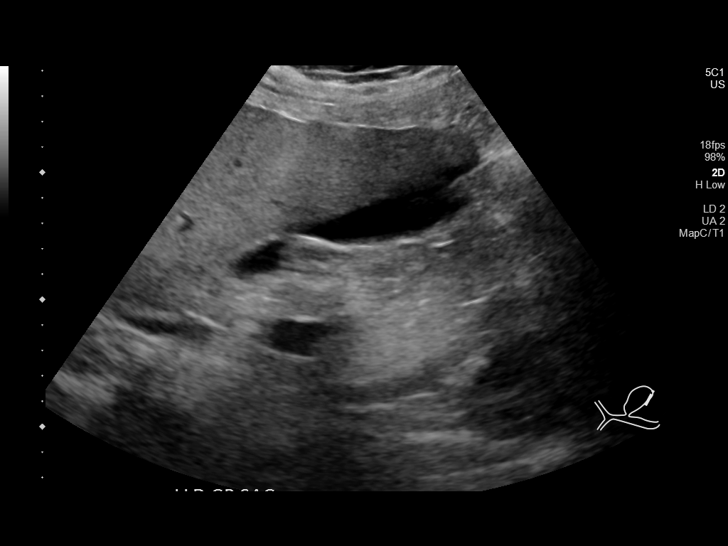
[im 16/41]
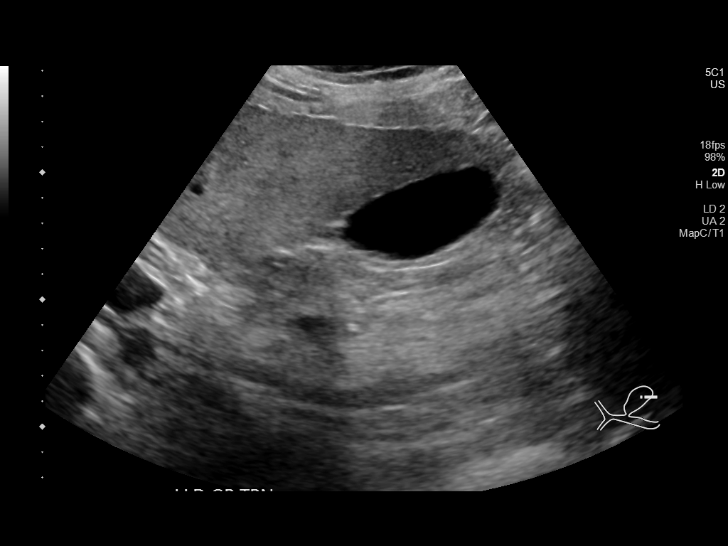
[im 19/41]
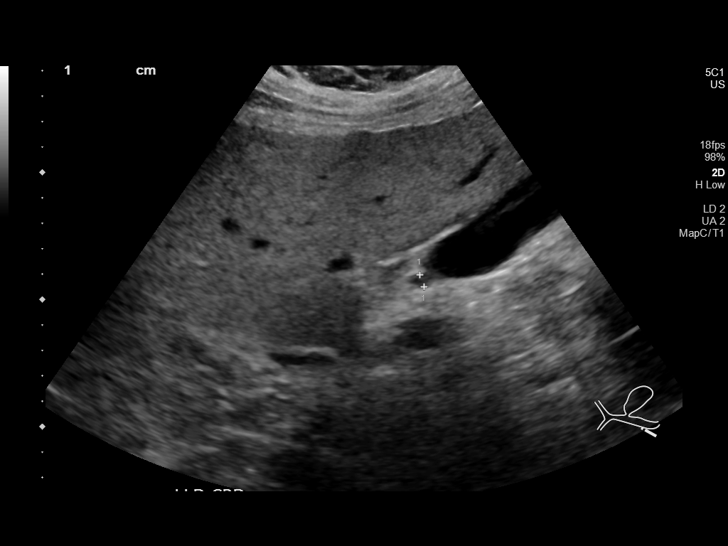
[im 22/41]
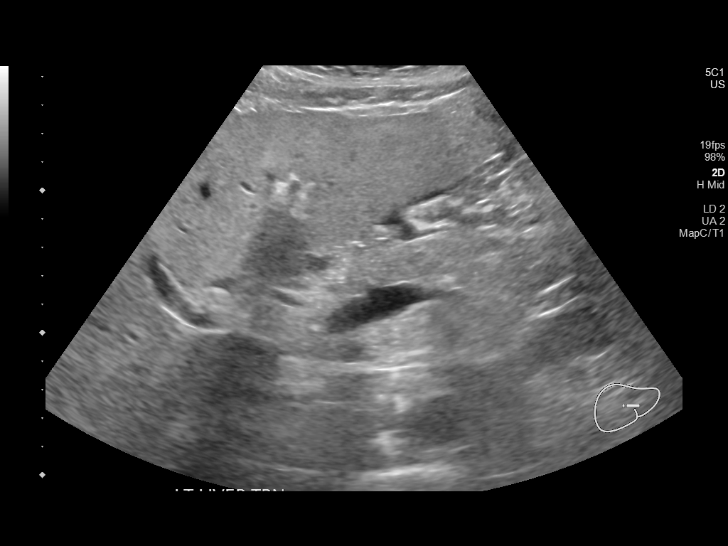
[im 26/41]
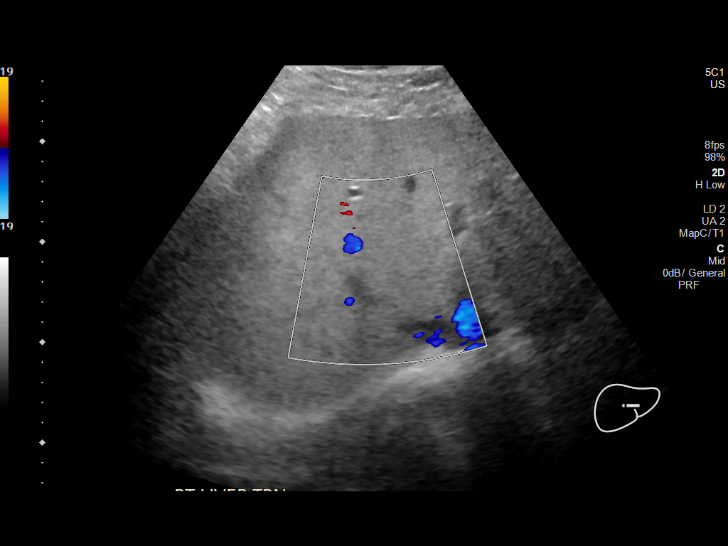
[im 27/41]
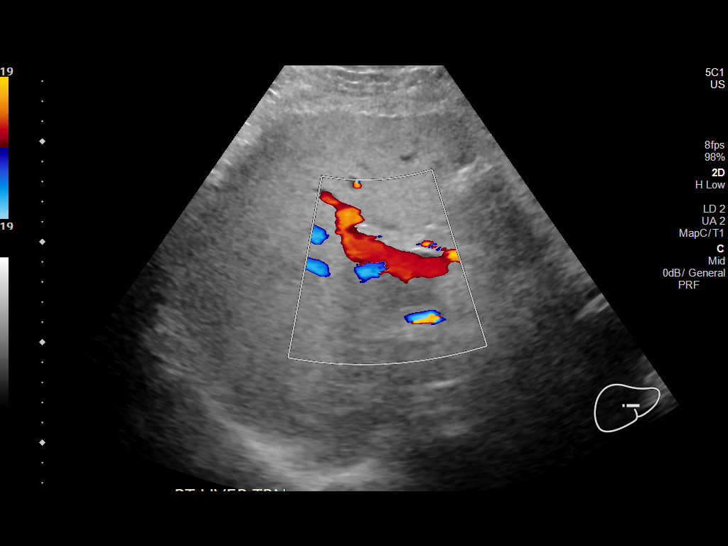
[im 31/41]
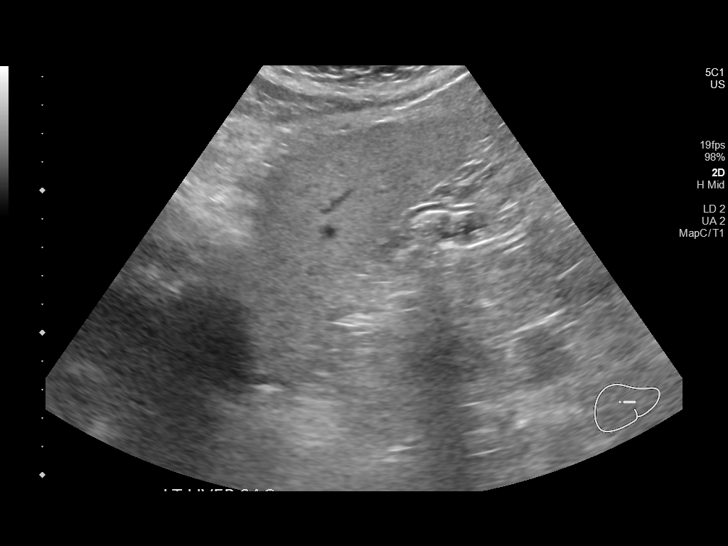
[im 34/41]
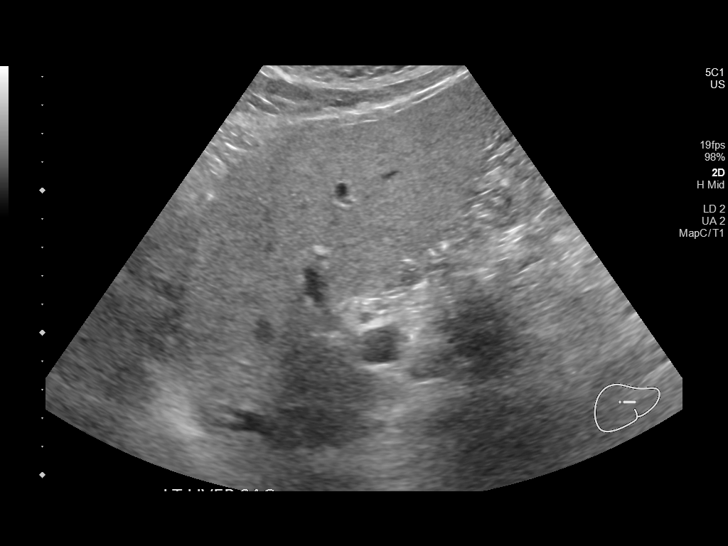
[im 37/41]
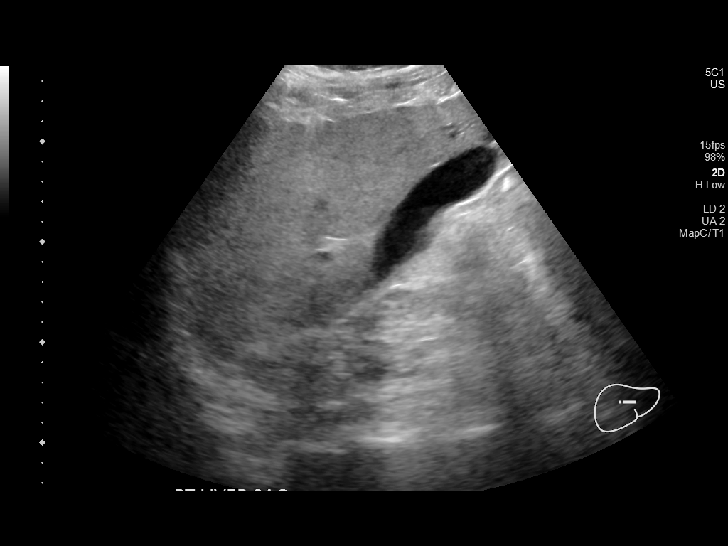
[im 41/41]
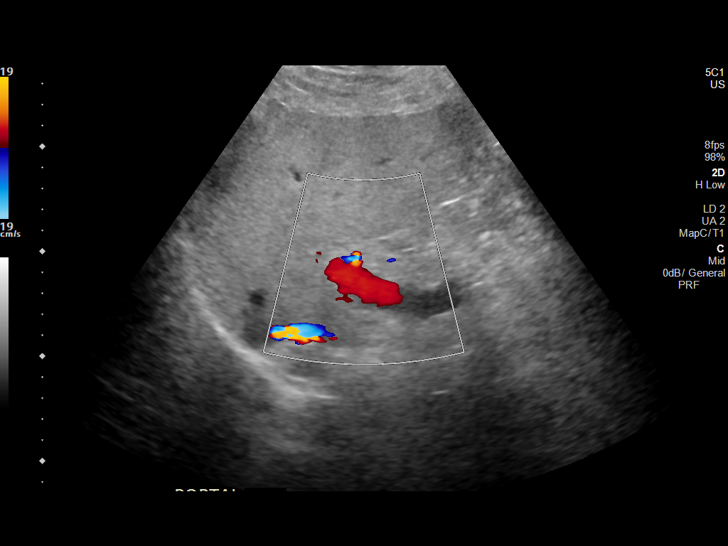

[14 of 25 positions shown; findings below may reference images not displayed]

FINDINGS: Gallbladder:

No gallstones or wall thickening visualized. No sonographic Murphy
sign noted by sonographer.

Common bile duct:

Diameter: 5 mm

Liver:

No focal lesion.

Diffusely increased parenchymal echogenicity.

Portal vein is patent on color Doppler imaging with normal direction
of blood flow towards the liver.

Other: None.
IMPRESSION: Diffuse increased echogenicity of the hepatic parenchyma is a
nonspecific indicator of hepatocellular dysfunction, most commonly
steatosis.

## 2024-11-18 ENCOUNTER — Ambulatory Visit: Admitting: Family Medicine
